# Patient Record
Sex: Male | Born: 1955 | Race: White | Hispanic: No | Marital: Married | Smoking: Former smoker
Health system: Southern US, Community
[De-identification: ages and names within clinical notes are randomized; demographics above are authoritative.]

## PROBLEM LIST (undated history)

## (undated) DIAGNOSIS — Z9889 Other specified postprocedural states: Secondary | ICD-10-CM

## (undated) DIAGNOSIS — K219 Gastro-esophageal reflux disease without esophagitis: Secondary | ICD-10-CM

## (undated) DIAGNOSIS — K5900 Constipation, unspecified: Secondary | ICD-10-CM

## (undated) DIAGNOSIS — R112 Nausea with vomiting, unspecified: Secondary | ICD-10-CM

## (undated) DIAGNOSIS — K5792 Diverticulitis of intestine, part unspecified, without perforation or abscess without bleeding: Secondary | ICD-10-CM

## (undated) DIAGNOSIS — E785 Hyperlipidemia, unspecified: Secondary | ICD-10-CM

## (undated) DIAGNOSIS — R202 Paresthesia of skin: Secondary | ICD-10-CM

## (undated) DIAGNOSIS — N529 Male erectile dysfunction, unspecified: Secondary | ICD-10-CM

## (undated) DIAGNOSIS — M549 Dorsalgia, unspecified: Secondary | ICD-10-CM

## (undated) DIAGNOSIS — T4145XA Adverse effect of unspecified anesthetic, initial encounter: Secondary | ICD-10-CM

## (undated) DIAGNOSIS — G709 Myoneural disorder, unspecified: Secondary | ICD-10-CM

## (undated) DIAGNOSIS — T8859XA Other complications of anesthesia, initial encounter: Secondary | ICD-10-CM

## (undated) DIAGNOSIS — M1990 Unspecified osteoarthritis, unspecified site: Secondary | ICD-10-CM

## (undated) DIAGNOSIS — K5790 Diverticulosis of intestine, part unspecified, without perforation or abscess without bleeding: Secondary | ICD-10-CM

## (undated) DIAGNOSIS — F419 Anxiety disorder, unspecified: Secondary | ICD-10-CM

## (undated) HISTORY — PX: POLYPECTOMY: SHX149

## (undated) HISTORY — DX: Hyperlipidemia, unspecified: E78.5

## (undated) HISTORY — DX: Dorsalgia, unspecified: M54.9

## (undated) HISTORY — DX: Unspecified osteoarthritis, unspecified site: M19.90

## (undated) HISTORY — DX: Diverticulosis of intestine, part unspecified, without perforation or abscess without bleeding: K57.90

## (undated) HISTORY — PX: SPINAL CORD STIMULATOR IMPLANT: SHX2422

## (undated) HISTORY — PX: SHOULDER SURGERY: SHX246

## (undated) HISTORY — DX: Myoneural disorder, unspecified: G70.9

## (undated) HISTORY — DX: Diverticulitis of intestine, part unspecified, without perforation or abscess without bleeding: K57.92

## (undated) HISTORY — PX: COLONOSCOPY: SHX174

## (undated) HISTORY — PX: SPINE SURGERY: SHX786

## (undated) HISTORY — DX: Paresthesia of skin: R20.2

## (undated) HISTORY — PX: LAMINECTOMY: SHX219

## (undated) HISTORY — DX: Gastro-esophageal reflux disease without esophagitis: K21.9

## (undated) HISTORY — DX: Anxiety disorder, unspecified: F41.9

## (undated) HISTORY — DX: Constipation, unspecified: K59.00

## (undated) HISTORY — DX: Male erectile dysfunction, unspecified: N52.9

## (undated) HISTORY — PX: BACK SURGERY: SHX140

## (undated) HISTORY — PX: APPENDECTOMY: SHX54

---
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
## 1998-04-11 ENCOUNTER — Ambulatory Visit (HOSPITAL_COMMUNITY): Admission: RE | Admit: 1998-04-11 | Discharge: 1998-04-11 | Payer: Self-pay | Admitting: *Deleted

## 1998-04-26 ENCOUNTER — Inpatient Hospital Stay (HOSPITAL_COMMUNITY): Admission: RE | Admit: 1998-04-26 | Discharge: 1998-04-30 | Payer: Self-pay | Admitting: Neurosurgery

## 2000-06-12 ENCOUNTER — Encounter: Admission: RE | Admit: 2000-06-12 | Discharge: 2000-06-12 | Payer: Self-pay | Admitting: Neurosurgery

## 2000-06-12 ENCOUNTER — Encounter: Payer: Self-pay | Admitting: Neurosurgery

## 2000-11-13 ENCOUNTER — Inpatient Hospital Stay (HOSPITAL_COMMUNITY): Admission: EM | Admit: 2000-11-13 | Discharge: 2000-11-16 | Payer: Self-pay | Admitting: Emergency Medicine

## 2000-11-13 ENCOUNTER — Encounter (INDEPENDENT_AMBULATORY_CARE_PROVIDER_SITE_OTHER): Payer: Self-pay | Admitting: Specialist

## 2001-01-05 ENCOUNTER — Encounter: Payer: Self-pay | Admitting: Surgery

## 2001-01-05 ENCOUNTER — Encounter: Admission: RE | Admit: 2001-01-05 | Discharge: 2001-01-05 | Payer: Self-pay | Admitting: Surgery

## 2004-09-23 ENCOUNTER — Ambulatory Visit: Payer: Self-pay | Admitting: Gastroenterology

## 2005-02-28 ENCOUNTER — Ambulatory Visit: Payer: Self-pay | Admitting: Family Medicine

## 2005-03-03 ENCOUNTER — Ambulatory Visit: Payer: Self-pay | Admitting: Family Medicine

## 2006-04-08 ENCOUNTER — Ambulatory Visit: Payer: Self-pay | Admitting: Family Medicine

## 2006-04-16 ENCOUNTER — Ambulatory Visit: Payer: Self-pay | Admitting: Family Medicine

## 2006-05-14 ENCOUNTER — Ambulatory Visit: Payer: Self-pay | Admitting: Family Medicine

## 2006-10-06 ENCOUNTER — Ambulatory Visit: Payer: Self-pay | Admitting: Family Medicine

## 2006-10-27 ENCOUNTER — Ambulatory Visit: Payer: Self-pay | Admitting: Family Medicine

## 2006-11-27 ENCOUNTER — Ambulatory Visit: Payer: Self-pay | Admitting: Family Medicine

## 2007-04-16 ENCOUNTER — Encounter: Payer: Self-pay | Admitting: Family Medicine

## 2007-04-18 DIAGNOSIS — R7989 Other specified abnormal findings of blood chemistry: Secondary | ICD-10-CM

## 2007-04-18 DIAGNOSIS — R739 Hyperglycemia, unspecified: Secondary | ICD-10-CM | POA: Insufficient documentation

## 2007-04-18 DIAGNOSIS — H9190 Unspecified hearing loss, unspecified ear: Secondary | ICD-10-CM | POA: Insufficient documentation

## 2007-04-20 ENCOUNTER — Ambulatory Visit: Payer: Self-pay | Admitting: Family Medicine

## 2007-04-20 LAB — CONVERTED CEMR LAB
AST: 24 units/L (ref 0–37)
Bilirubin, Direct: 0.1 mg/dL (ref 0.0–0.3)
Chloride: 113 meq/L — ABNORMAL HIGH (ref 96–112)
Cholesterol: 163 mg/dL (ref 0–200)
Creatinine, Ser: 0.9 mg/dL (ref 0.4–1.5)
Eosinophils Relative: 0.6 % (ref 0.0–5.0)
Glucose, Bld: 100 mg/dL — ABNORMAL HIGH (ref 70–99)
HCT: 44.4 % (ref 39.0–52.0)
HDL: 45.7 mg/dL (ref >39.0)
Hemoglobin: 15.3 g/dL (ref 13.0–17.0)
LDL Cholesterol: 101 mg/dL — ABNORMAL HIGH (ref 0–99)
MCV: 87.7 fL (ref 78.0–100.0)
Neutrophils Relative %: 63.4 % (ref 43.0–77.0)
PSA: 0.32 ng/mL (ref 0.10–4.00)
RBC: 5.06 M/uL (ref 4.22–5.81)
RDW: 12.8 % (ref 11.5–14.6)
Sodium: 146 meq/L — ABNORMAL HIGH (ref 135–145)
Total Bilirubin: 0.9 mg/dL (ref 0.3–1.2)
Total Protein: 7.5 g/dL (ref 6.0–8.3)
Triglycerides: 83 mg/dL (ref 0–149)
WBC: 6.8 10*3/uL (ref 4.5–10.5)

## 2007-04-22 ENCOUNTER — Ambulatory Visit: Payer: Self-pay | Admitting: Family Medicine

## 2007-04-22 DIAGNOSIS — F3289 Other specified depressive episodes: Secondary | ICD-10-CM | POA: Insufficient documentation

## 2007-04-22 DIAGNOSIS — F329 Major depressive disorder, single episode, unspecified: Secondary | ICD-10-CM | POA: Insufficient documentation

## 2007-04-22 DIAGNOSIS — E7800 Pure hypercholesterolemia, unspecified: Secondary | ICD-10-CM | POA: Insufficient documentation

## 2007-05-20 ENCOUNTER — Encounter (INDEPENDENT_AMBULATORY_CARE_PROVIDER_SITE_OTHER): Payer: Self-pay | Admitting: *Deleted

## 2007-05-20 ENCOUNTER — Ambulatory Visit: Payer: Self-pay | Admitting: Family Medicine

## 2007-05-25 ENCOUNTER — Ambulatory Visit: Payer: Self-pay | Admitting: Family Medicine

## 2007-06-16 ENCOUNTER — Encounter: Payer: Self-pay | Admitting: Family Medicine

## 2007-06-29 ENCOUNTER — Ambulatory Visit: Payer: Self-pay | Admitting: Family Medicine

## 2007-09-19 ENCOUNTER — Encounter: Admission: RE | Admit: 2007-09-19 | Discharge: 2007-09-19 | Payer: Self-pay | Admitting: Neurosurgery

## 2007-10-25 ENCOUNTER — Telehealth: Payer: Self-pay | Admitting: Family Medicine

## 2007-10-26 ENCOUNTER — Ambulatory Visit: Payer: Self-pay | Admitting: Family Medicine

## 2007-12-07 ENCOUNTER — Encounter: Payer: Self-pay | Admitting: Family Medicine

## 2008-01-25 ENCOUNTER — Telehealth: Payer: Self-pay | Admitting: Family Medicine

## 2008-02-03 ENCOUNTER — Encounter: Payer: Self-pay | Admitting: Family Medicine

## 2008-02-29 ENCOUNTER — Inpatient Hospital Stay (HOSPITAL_COMMUNITY): Admission: RE | Admit: 2008-02-29 | Discharge: 2008-03-04 | Payer: Self-pay | Admitting: Neurosurgery

## 2008-03-15 ENCOUNTER — Encounter: Payer: Self-pay | Admitting: Family Medicine

## 2008-04-17 ENCOUNTER — Encounter: Payer: Self-pay | Admitting: Family Medicine

## 2008-04-19 ENCOUNTER — Ambulatory Visit: Payer: Self-pay | Admitting: Family Medicine

## 2008-04-19 LAB — CONVERTED CEMR LAB
AST: 24 units/L (ref 0–37)
Albumin: 4.2 g/dL (ref 3.5–5.2)
Alkaline Phosphatase: 68 units/L (ref 39–117)
BUN: 11 mg/dL (ref 6–23)
Eosinophils Relative: 1.8 % (ref 0.0–5.0)
GFR calc Af Amer: 132 mL/min
Glucose, Bld: 107 mg/dL — ABNORMAL HIGH (ref 70–99)
HCT: 38.8 % — ABNORMAL LOW (ref 39.0–52.0)
Hemoglobin: 13 g/dL (ref 13.0–17.0)
Microalb Creat Ratio: 3.8 mg/g (ref 0.0–30.0)
Monocytes Absolute: 0.6 10*3/uL (ref 0.1–1.0)
Monocytes Relative: 8.7 % (ref 3.0–12.0)
Platelets: 165 10*3/uL (ref 150–400)
Potassium: 4.2 meq/L (ref 3.5–5.1)
RBC: 4.45 M/uL (ref 4.22–5.81)
Total CHOL/HDL Ratio: 4.7
Total Protein: 7.2 g/dL (ref 6.0–8.3)
WBC: 6.5 10*3/uL (ref 4.5–10.5)

## 2008-04-25 ENCOUNTER — Ambulatory Visit: Payer: Self-pay | Admitting: Family Medicine

## 2008-05-12 ENCOUNTER — Ambulatory Visit: Payer: Self-pay | Admitting: Family Medicine

## 2008-05-12 LAB — FECAL OCCULT BLOOD, GUAIAC: Fecal Occult Blood: NEGATIVE

## 2008-05-12 LAB — CONVERTED CEMR LAB
OCCULT 2: NEGATIVE
OCCULT 3: NEGATIVE

## 2008-05-15 ENCOUNTER — Encounter (INDEPENDENT_AMBULATORY_CARE_PROVIDER_SITE_OTHER): Payer: Self-pay | Admitting: *Deleted

## 2008-06-20 ENCOUNTER — Encounter: Payer: Self-pay | Admitting: Family Medicine

## 2008-07-10 ENCOUNTER — Encounter: Payer: Self-pay | Admitting: Family Medicine

## 2008-07-25 ENCOUNTER — Telehealth: Payer: Self-pay | Admitting: Family Medicine

## 2008-08-28 ENCOUNTER — Telehealth: Payer: Self-pay | Admitting: Family Medicine

## 2008-09-13 ENCOUNTER — Encounter: Payer: Self-pay | Admitting: Family Medicine

## 2008-09-27 ENCOUNTER — Telehealth: Payer: Self-pay | Admitting: Family Medicine

## 2008-10-30 ENCOUNTER — Telehealth: Payer: Self-pay | Admitting: Family Medicine

## 2008-11-30 ENCOUNTER — Telehealth: Payer: Self-pay | Admitting: Family Medicine

## 2008-12-20 ENCOUNTER — Encounter: Payer: Self-pay | Admitting: Family Medicine

## 2009-01-01 ENCOUNTER — Telehealth: Payer: Self-pay | Admitting: Family Medicine

## 2009-02-01 ENCOUNTER — Telehealth: Payer: Self-pay | Admitting: Family Medicine

## 2009-03-14 ENCOUNTER — Telehealth: Payer: Self-pay | Admitting: Family Medicine

## 2009-03-21 ENCOUNTER — Inpatient Hospital Stay (HOSPITAL_COMMUNITY): Admission: RE | Admit: 2009-03-21 | Discharge: 2009-03-22 | Payer: Self-pay | Admitting: Neurosurgery

## 2009-03-28 ENCOUNTER — Ambulatory Visit (HOSPITAL_COMMUNITY): Admission: RE | Admit: 2009-03-28 | Discharge: 2009-03-28 | Payer: Self-pay | Admitting: Neurosurgery

## 2009-04-09 ENCOUNTER — Encounter: Payer: Self-pay | Admitting: Family Medicine

## 2009-04-24 ENCOUNTER — Ambulatory Visit: Payer: Self-pay | Admitting: Family Medicine

## 2009-04-24 LAB — CONVERTED CEMR LAB
Albumin: 4.4 g/dL (ref 3.5–5.2)
Alkaline Phosphatase: 52 units/L (ref 39–117)
BUN: 11 mg/dL (ref 6–23)
Basophils Absolute: 0.1 10*3/uL (ref 0.0–0.1)
Bilirubin, Direct: 0.1 mg/dL (ref 0.0–0.3)
CO2: 30 meq/L (ref 19–32)
Calcium: 9.3 mg/dL (ref 8.4–10.5)
Cholesterol: 154 mg/dL (ref 0–200)
Creatinine, Ser: 0.9 mg/dL (ref 0.4–1.5)
Eosinophils Absolute: 0.2 10*3/uL (ref 0.0–0.7)
Glucose, Bld: 95 mg/dL (ref 70–99)
HDL: 44.3 mg/dL (ref >39.00)
Lymphocytes Relative: 38.1 % (ref 12.0–46.0)
MCHC: 35.1 g/dL (ref 30.0–36.0)
Monocytes Absolute: 0.5 10*3/uL (ref 0.1–1.0)
Neutrophils Relative %: 49.7 % (ref 43.0–77.0)
PSA: 0.31 ng/mL (ref 0.10–4.00)
RDW: 13.3 % (ref 11.5–14.6)
Triglycerides: 61 mg/dL (ref 0.0–149.0)

## 2009-04-25 LAB — CONVERTED CEMR LAB: Vit D, 25-Hydroxy: 26 ng/mL — ABNORMAL LOW (ref 30–89)

## 2009-04-26 ENCOUNTER — Ambulatory Visit: Payer: Self-pay | Admitting: Family Medicine

## 2009-05-07 ENCOUNTER — Encounter: Payer: Self-pay | Admitting: Family Medicine

## 2009-06-04 ENCOUNTER — Encounter (INDEPENDENT_AMBULATORY_CARE_PROVIDER_SITE_OTHER): Payer: Self-pay | Admitting: *Deleted

## 2009-07-17 ENCOUNTER — Encounter: Payer: Self-pay | Admitting: Family Medicine

## 2009-08-30 ENCOUNTER — Encounter (INDEPENDENT_AMBULATORY_CARE_PROVIDER_SITE_OTHER): Payer: Self-pay | Admitting: *Deleted

## 2009-09-28 ENCOUNTER — Encounter (INDEPENDENT_AMBULATORY_CARE_PROVIDER_SITE_OTHER): Payer: Self-pay

## 2009-10-02 ENCOUNTER — Ambulatory Visit: Payer: Self-pay | Admitting: Gastroenterology

## 2009-10-03 ENCOUNTER — Encounter: Payer: Self-pay | Admitting: Family Medicine

## 2009-10-18 ENCOUNTER — Ambulatory Visit: Payer: Self-pay | Admitting: Gastroenterology

## 2009-10-18 LAB — HM COLONOSCOPY: HM Colonoscopy: NORMAL

## 2009-10-22 ENCOUNTER — Encounter: Payer: Self-pay | Admitting: Family Medicine

## 2009-12-12 ENCOUNTER — Telehealth: Payer: Self-pay | Admitting: Family Medicine

## 2010-01-09 ENCOUNTER — Telehealth: Payer: Self-pay | Admitting: Family Medicine

## 2010-02-04 ENCOUNTER — Encounter: Payer: Self-pay | Admitting: Family Medicine

## 2010-02-11 ENCOUNTER — Telehealth: Payer: Self-pay | Admitting: Family Medicine

## 2010-03-15 IMAGING — CR DG THORACOLUMBAR SPINE 2V
1 series · 1 of 1 positions shown · non-contrast
Comparison: [DATE]

CLINICAL DATA: Spinal cord stimulator placement.

THORACOLUMBAR SPINE - 2 VIEW

[view not recorded]
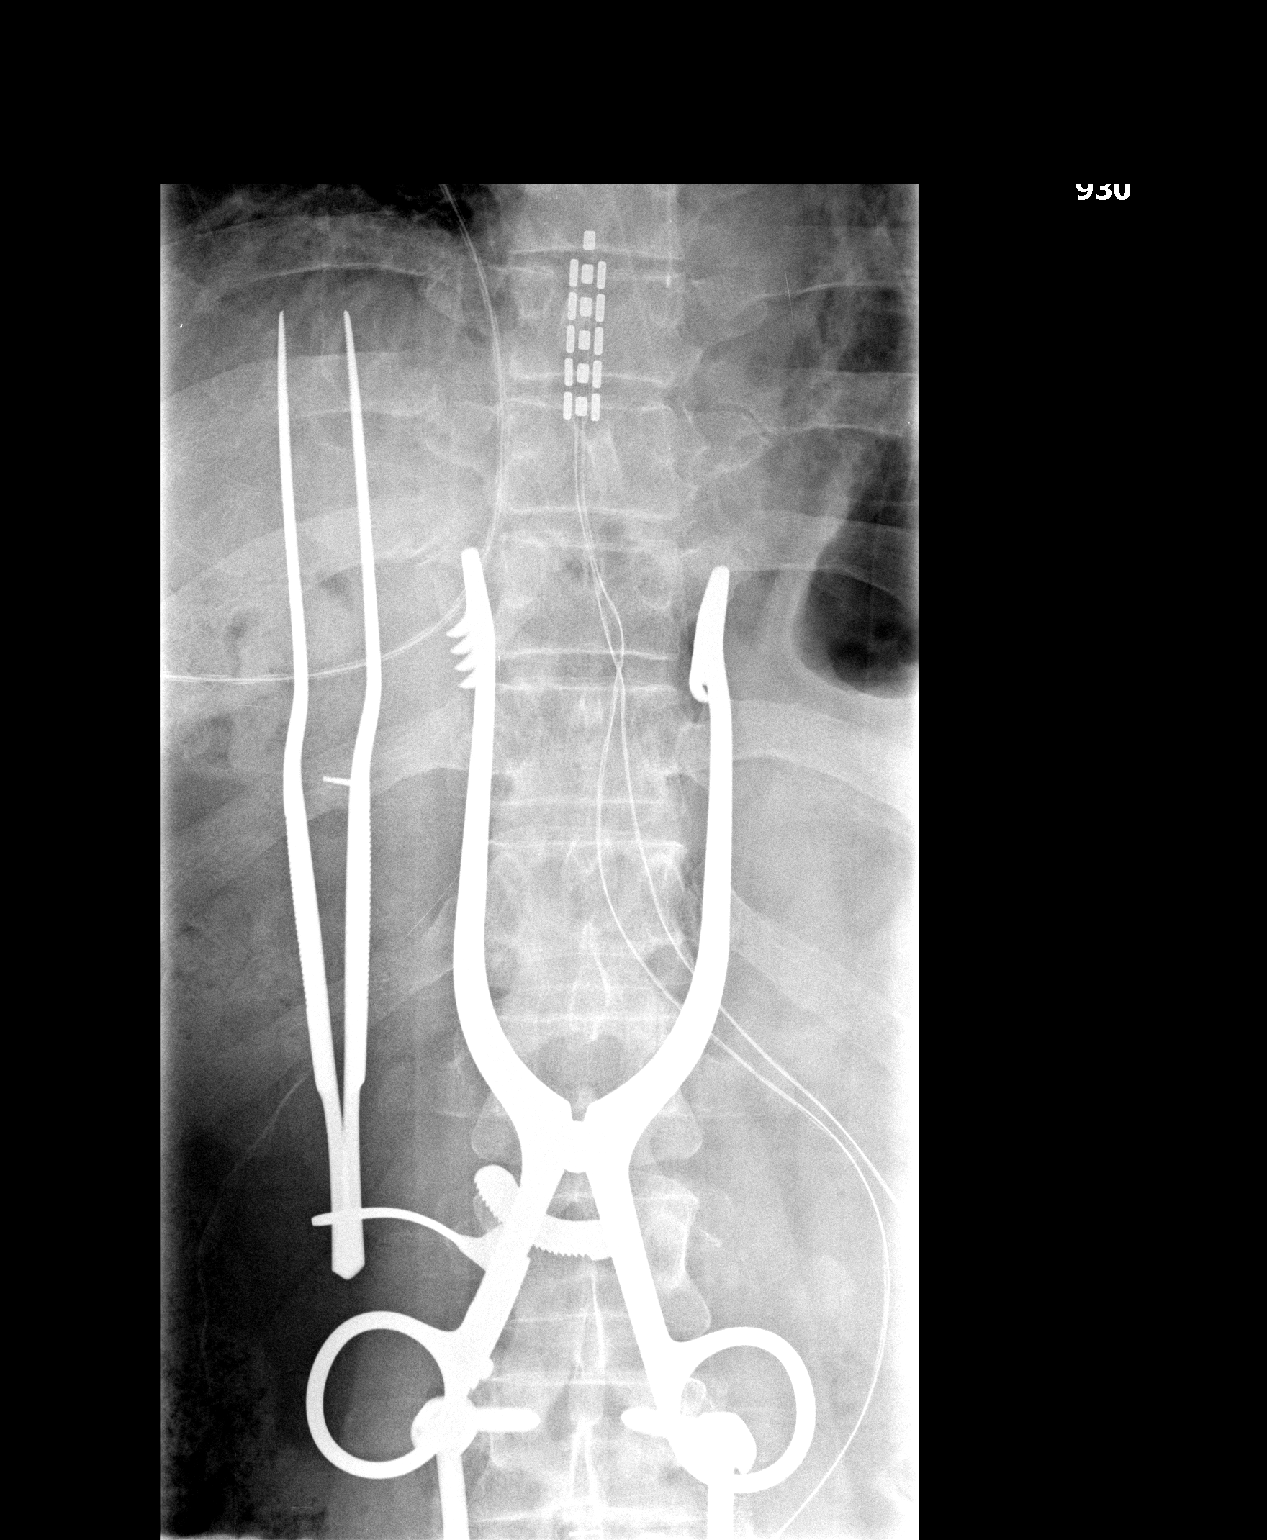

[1 of 1 positions shown; findings below may reference images not displayed]

FINDINGS: An initial film shows a needle marking the T12 level.  A
second film shows a sponge is in place at the T10 level.  A third
film shows stimulators in place in the midline from the T7-8 disc
space to the T8-9 disc space.  A final film shows the stimulator
positioned from the T8-9 disc space to the T9-10 disc space.
IMPRESSION: Spinal cord stimulator in the midline centered at T9

## 2010-03-22 ENCOUNTER — Telehealth: Payer: Self-pay | Admitting: Family Medicine

## 2010-05-10 ENCOUNTER — Telehealth (INDEPENDENT_AMBULATORY_CARE_PROVIDER_SITE_OTHER): Payer: Self-pay | Admitting: *Deleted

## 2010-05-13 ENCOUNTER — Ambulatory Visit: Payer: Self-pay | Admitting: Family Medicine

## 2010-05-14 LAB — CONVERTED CEMR LAB
ALT: 27 units/L (ref 0–53)
Bilirubin, Direct: 0.2 mg/dL (ref 0.0–0.3)
Chloride: 101 meq/L (ref 96–112)
Cholesterol: 151 mg/dL (ref 0–200)
Creatinine, Ser: 0.8 mg/dL (ref 0.4–1.5)
GFR calc non Af Amer: 105.99 mL/min (ref >60)
HDL: 43.4 mg/dL (ref >39.00)
LDL Cholesterol: 88 mg/dL (ref 0–99)
PSA: 0.36 ng/mL (ref 0.10–4.00)
Potassium: 4.3 meq/L (ref 3.5–5.1)
Total Bilirubin: 0.8 mg/dL (ref 0.3–1.2)
Total CHOL/HDL Ratio: 3
Triglycerides: 100 mg/dL (ref 0.0–149.0)
VLDL: 20 mg/dL (ref 0.0–40.0)

## 2010-05-17 ENCOUNTER — Ambulatory Visit: Payer: Self-pay | Admitting: Family Medicine

## 2010-06-10 ENCOUNTER — Encounter: Payer: Self-pay | Admitting: Family Medicine

## 2010-06-20 ENCOUNTER — Encounter: Admission: RE | Admit: 2010-06-20 | Discharge: 2010-06-20 | Payer: Self-pay | Admitting: Neurosurgery

## 2010-07-10 ENCOUNTER — Encounter: Payer: Self-pay | Admitting: Family Medicine

## 2010-11-21 NOTE — Progress Notes (Signed)
Summary: Rx Hydrocodone  Phone Note Refill Request Call back at [phone] Message from:  [facility] on January 09, 2010 8:21 AM  Refills Requested: Medication #1:  HYDROCODONE-ACETAMINOPHEN 10-325 MG TABS one tab by mouth three  times a day as needed for back pain.   Last Refilled: 12/12/2009  Method Requested: Telephone to Pharmacy Initial call taken by: Sydell Axon LPN,  January 09, 2010 8:22 AM  Follow-up for Phone Call        Oxycodone Rx. was a mistake, not phoned in. Follow-up by: Delilah Shan CMA Duncan Dull),  January 09, 2010 12:36 PM  Additional Follow-up for Phone Call Additional follow up Details #1::        Medication phoned to pharmacy.  Additional Follow-up by: Delilah Shan CMA Duncan Dull),  January 09, 2010 12:38 PM    Prescriptions: HYDROCODONE-ACETAMINOPHEN 10-325 MG TABS (HYDROCODONE-ACETAMINOPHEN) one tab by mouth three  times a day as needed for back pain  #90 x 0   Entered and Authorized by:   Ruthe Mannan MD   Signed by:   Ruthe Mannan MD on 01/09/2010   Method used:   Handwritten   RxID:   [id number] OXYCODONE HCL 5 MG TABS (OXYCODONE HCL) 2-4 tabs every 6 hrs as needed  #180 x 0   Entered and Authorized by:   Ruthe Mannan MD   Signed by:   Ruthe Mannan MD on 01/09/2010   Method used:   Handwritten   RxID:   [id number]

## 2010-11-21 NOTE — Assessment & Plan Note (Signed)
Summary: CPX/CLE   Vital Signs:  Patient profile:   55 year old male Height:      77 inches Weight:      210.25 pounds BMI:     25.02 Temp:     98.3 degrees F oral Pulse rate:   76 / minute Pulse rhythm:   regular BP sitting:   122 / 74  (left arm) Cuff size:   large  Vitals Entered By: Delilah Shan CMA Duncan Dull) (May 17, 2010 8:34 AM) CC: CPX, Preventive Care   History of Present Illness: Here for CPE.   See prev med.   Back pain per Dr. Vear Clock.   Elevated Cholesterol: Using medications without problems:yes Muscle aches: no Other complaints: no  labs reviewed.  mild increase in glucose,but patient had a large dessert the day before the labs.  No h/o DM2 in patient.    Current Medications (verified): 1)  Colace 100 Mg  Caps (Docusate Sodium) .Marland Kitchen.. 1 Daily By Mouth 2)  Lipitor 40 Mg Tabs (Atorvastatin Calcium) .... Take 1/2 Tablet At Bedtime By Mouth 3)  Stool Softener With Laxative .Marland Kitchen.. 50 Mg.   Take 1 Tablet By Mouth Once A Day 4)  Exalgo 8 Mg Xr24h-Tab (Hydromorphone Hcl) .... Take 1 Tablet By Mouth Once A Day 5)  Multivitamins   Tabs (Multiple Vitamin) .... Take 1 Tablet By Mouth Once A Day  Allergies: 1)  ! * Lexapro 2)  ! Zoloft  Past History:  Past Surgical History: Last updated: 10/21/2009 Laminectomy L4/5 1975 Laminectomy L5/S1 1990 [place] Cage procedure L4/5 (Dr. Venetia Maxon) 07/99 Hosp/ surgery abd. pain-diverticulosis with rupture 01/02 BE, divertics, polyp at hepatic flexure 03/02 Colonoscophy wnl Arlyce Dice) 2005 5 years  Lumbar back surgery L3/4 ans L5/S1 rods and screws L3-S1 (Dr Channing Mutters) 02/29/2008 Colonoscopy Nml (Dr Arlyce Dice) 10/18/2009         86yrs  Family History: Last updated: 05/17/2010 Father: Alive, with heart attack, elevated cholesterol, and 5 back surgeries. 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 Vear Clock with pain clinic for back 2011 HLD  Family History: Reviewed history from 04/26/2009 and no changes required. Father: Alive, with heart attack, elevated cholesterol, and 5 back surgeries. Mother:dead 61   Alzheimer Half- brother dec 66  colon cancer, recurrent s/p partial resection, abd tumor Sister A 57  alive and well  Social History: Reviewed history from 04/26/2009 and no changes required. 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  Otherwise negative.    Physical Exam  General:  GEN: nad, alert and oriented, uncomfortable from back pain HEENT: mucous membranes moist, tm wnl bilateral  NECK: supple w/o LA CV: rrr.  no murmur PULM: ctab, no inc wob ABD: soft, +bs EXT: no edema SKIN: no acute rash but old scars noted on back Rectal:  No external abnormalities noted. Normal sphincter tone. No rectal masses or tenderness. Heme neg stool Prostate:  Prostate gland firm and smooth, no enlargement, nodularity, tenderness,  mass, asymmetry or induration.    Impression & Recommendations:  Problem # 1:  HEALTH MAINTENANCE EXAM (ICD-V70.0) See prev med below.  Pt to follow up with pain clinic.  Check glucose yearly.   Problem # 2:  HYPERCHOLESTEROLEMIA (297 LDL 210) (ICD-272.0) Controlled, no  change in meds.  His updated medication list for this problem includes:    Lipitor 40 Mg Tabs (Atorvastatin calcium) .Marland Kitchen... Take 1/2 tablet at bedtime by mouth  Complete Medication List: 1)  Colace 100 Mg Caps (Docusate sodium) .Marland Kitchen.. 1 daily by mouth 2)  Lipitor 40 Mg Tabs  (Atorvastatin calcium) .... Take 1/2 tablet at bedtime by mouth 3)  Stool Softener With Laxative  .Marland Kitchen.. 50 mg.   take 1 tablet by mouth once a day 4)  Exalgo 8 Mg Xr24h-tab (Hydromorphone hcl) .... Take 1 tablet by mouth once a day 5)  Multivitamins Tabs (Multiple vitamin) .... Take 1 tablet by mouth once a day  Colorectal Screening:  Current Recommendations:    Hemoccult: NEG X 1 today  PSA Screening:    PSA: 0.36  (05/13/2010)  Immunization & Chemoprophylaxis:    Tetanus vaccine: Td  (01/02/2003)  Patient Instructions: 1)  Please schedule a follow-up appointment in 1 year for a physical.  Let us know if you need anything in the meantime.  Take care.   Current Allergies (reviewed today): ! * LEXAPRO ! ZOLOFT    Prevention & Chronic Care Immunizations   Influenza vaccine: Not documented   Influenza vaccine deferral: Not available  (05/17/2010)   Influenza vaccine due: 06/20/2010    Tetanus booster: 01/02/2003: Td    Pneumococcal vaccine: Not documented  Colorectal Screening   Hemoccult: negative  (05/12/2008)   Hemoccult action/deferral: NEG X 1 today  (05/17/2010)    Colonoscopy: DONE  (10/18/2009)   Colonoscopy due: 09/2014  Other Screening   PSA: 0.36  (05/13/2010)   Smoking status: quit  (04/26/2009)  Lipids   Total Cholesterol: 151  (05/13/2010)   LDL: 88  (05/13/2010)   LDL Direct: Not documented   HDL: 43.40  (05/13/2010)   Triglycerides: 100.0  (05/13/2010)    SGOT (AST): 20  (05/13/2010)   SGPT (ALT): 27  (05/13/2010)   Alkaline phosphatase: 53  (05/13/2010)   Total bilirubin: 0.8  (05/13/2010)    Lipid flowsheet reviewed?: Yes  Self-Management Support :    Lipid self-management support: Not documented

## 2010-11-21 NOTE — Progress Notes (Signed)
Summary: refill request for vicodin  Phone Note Refill Request Message from:  Fax from Pharmacy  Refills Requested: Medication #1:  HYDROCODONE-ACETAMINOPHEN 10-325 MG TABS one tab by mouth three  times a day as needed for back pain.   Last Refilled: 02/12/2010 Faxed request from [facility] [place], [phone].   Initial call taken by: Lowella Petties CMA,  March 22, 2010 2:14 PM  Follow-up for Phone Call        Rx called to pharmacy Follow-up by: Linde Gillis CMA Duncan Dull),  March 22, 2010 3:07 PM    Prescriptions: HYDROCODONE-ACETAMINOPHEN 10-325 MG TABS (HYDROCODONE-ACETAMINOPHEN) one tab by mouth three  times a day as needed for back pain  #90 x 0   Entered and Authorized by:   Ruthe Mannan MD   Signed by:   Ruthe Mannan MD on 03/22/2010   Method used:   Telephoned to ...       [facility]* (mail-order)             ,          Ph: [phone]       Fax: [phone]   RxID:   [id number]

## 2010-11-21 NOTE — Progress Notes (Signed)
----   Converted from flag ---- ---- 05/10/2010 12:14 PM, Crawford Givens MD wrote: psa v76.44 cmet/lipid 272.0   ---- 05/10/2010 11:40 AM, Mills Koller wrote: Patient is scheduled for a cpx w/ you. I need lab orders w/dx, Please. Thanks, Terri ------------------------------

## 2010-11-21 NOTE — Progress Notes (Signed)
Summary: Rx Hydrocodone/APAP  Phone Note Refill Request Call back at [phone] Message from:  [facility] [place] on February 11, 2010 9:03 AM  Refills Requested: Medication #1:  HYDROCODONE-ACETAMINOPHEN 10-325 MG TABS one tab by mouth three  times a day as needed for back pain.   Last Refilled: 01/09/2010 Received faxed refill request please advise.  Dr. Dayton Martes out of the office until tomorrow.   Method Requested: Telephone to Pharmacy Initial call taken by: Linde Gillis CMA Duncan Dull),  February 11, 2010 9:04 AM  Follow-up for Phone Call        This is my patient. Dr Dayton Martes merely refilled a the medication last month. Follow-up by: Shaune Leeks MD,  February 11, 2010 9:06 AM  Additional Follow-up for Phone Call Additional follow up Details #1::        Rx called to pharmacy Additional Follow-up by: Linde Gillis CMA Duncan Dull),  February 11, 2010 10:16 AM    Prescriptions: HYDROCODONE-ACETAMINOPHEN 10-325 MG TABS (HYDROCODONE-ACETAMINOPHEN) one tab by mouth three  times a day as needed for back pain  #90 x 0   Entered and Authorized by:   Shaune Leeks MD   Signed by:   Shaune Leeks MD on 02/11/2010   Method used:   Print then Give to Patient   RxID:   [id number]

## 2010-11-21 NOTE — Letter (Signed)
Summary: Dr.Mark Roy,[facility],Note  Dr.Mark Roy,[facility],Note   Imported By: Beau Fanny 03/01/2010 16:35:41  _____________________________________________________________________  External Attachment:    Type:   Image     Comment:   External Document

## 2010-11-21 NOTE — Letter (Signed)
Summary: [facility]  [facility]   Imported By: Lanelle Bal 06/19/2010 09:55:48  _____________________________________________________________________  External Attachment:    Type:   Image     Comment:   External Document  Appended Document: [facility]     Clinical Lists Changes  Observations: Added new observation of PAST MED HX: Dr. Vear Clock with pain clinic for back 2011 Dr. Channing Mutters- back pain HLD (06/19/2010 13:22)       Past History:  Past Medical History: Dr. Vear Clock with pain clinic for back 2011 Dr. Channing Mutters- back pain HLD

## 2010-11-21 NOTE — Progress Notes (Signed)
Summary: Rx Hydrocodon  Phone Note Refill Request Call back at [phone] Message from:  [facility] [place] on December 12, 2009 3:26 PM  Refills Requested: Medication #1:  HYDROCODONE-ACETAMINOPHEN 10-325 MG TABS one tab by mouth three  times a day as needed for back pain.   Last Refilled: 11/12/2009 Received e-scribe refill from pharmacy   Method Requested: Telephone to Pharmacy Initial call taken by: Sydell Axon LPN,  December 12, 2009 3:29 PM  Follow-up for Phone Call        Rx called to pharmacy Follow-up by: Sydell Axon LPN,  December 12, 2009 4:54 PM    Prescriptions: HYDROCODONE-ACETAMINOPHEN 10-325 MG TABS (HYDROCODONE-ACETAMINOPHEN) one tab by mouth three  times a day as needed for back pain  #90 x 0   Entered and Authorized by:   Shaune Leeks MD   Signed by:   Shaune Leeks MD on 12/12/2009   Method used:   Print then Give to Patient   RxID:   [id number]

## 2010-11-21 NOTE — Letter (Signed)
Summary: Dr.Mark Roy,[facility],Note  Dr.Mark Roy,[facility]Note   Imported By: Beau Fanny 11/07/2009 10:41:31  _____________________________________________________________________  External Attachment:    Type:   Image     Comment:   External Document

## 2010-11-21 NOTE — Letter (Signed)
Summary: [facility]  [facility]   Imported By: Maryln Gottron 07/22/2010 13:44:17  _____________________________________________________________________  External Attachment:    Type:   Image     Comment:   External Document  Appended Document: [facility]     Clinical Lists Changes

## 2011-01-27 LAB — CBC
Hemoglobin: 14.8 g/dL (ref 13.0–17.0)
MCHC: 34.4 g/dL (ref 30.0–36.0)
Platelets: 141 10*3/uL — ABNORMAL LOW (ref 150–400)
RDW: 14 % (ref 11.5–15.5)

## 2011-01-28 LAB — COMPREHENSIVE METABOLIC PANEL
ALT: 28 U/L (ref 0–53)
AST: 21 U/L (ref 0–37)
Alkaline Phosphatase: 73 U/L (ref 39–117)
CO2: 29 mEq/L (ref 19–32)
Calcium: 9.8 mg/dL (ref 8.4–10.5)
Chloride: 104 mEq/L (ref 96–112)
GFR calc Af Amer: >60 mL/min (ref >60)
GFR calc non Af Amer: >60 mL/min (ref >60)
Glucose, Bld: 91 mg/dL (ref 70–99)
Potassium: 4.6 mEq/L (ref 3.5–5.1)
Sodium: 140 mEq/L (ref 135–145)

## 2011-01-28 LAB — URINALYSIS, ROUTINE W REFLEX MICROSCOPIC
Bilirubin Urine: NEGATIVE
Glucose, UA: NEGATIVE mg/dL
Hgb urine dipstick: NEGATIVE
Protein, ur: NEGATIVE mg/dL
Specific Gravity, Urine: 1.017 (ref 1.005–1.030)

## 2011-01-28 LAB — DIFFERENTIAL
Basophils Relative: 0 % (ref 0–1)
Eosinophils Absolute: 0.1 10*3/uL (ref 0.0–0.7)
Eosinophils Relative: 2 % (ref 0–5)
Lymphs Abs: 2.7 10*3/uL (ref 0.7–4.0)
Neutrophils Relative %: 56 % (ref 43–77)

## 2011-01-28 LAB — PROTIME-INR
INR: 1 (ref 0.00–1.49)
Prothrombin Time: 13.2 seconds (ref 11.6–15.2)

## 2011-01-28 LAB — CBC
Hemoglobin: 16.3 g/dL (ref 13.0–17.0)
RBC: 5.43 MIL/uL (ref 4.22–5.81)
WBC: 8 10*3/uL (ref 4.0–10.5)

## 2011-03-04 NOTE — H&P (Signed)
NAME:  Andrew Andrew [name], Andrew [name] NO.:  [id number]   MEDICAL RECORD NO.:  [id number]           PATIENT TYPE:  OIB   LOCATION:                               FACILITY:  [facility]   PHYSICIAN:  Payton Doughty, M.D.      DATE OF BIRTH:  02-25-1956   DATE OF ADMISSION:  03/28/2009  DATE OF DISCHARGE:                              HISTORY & PHYSICAL   ADMITTING DIAGNOSIS:  Intractable back pain with spinal cord stimulator  in place.   BODY OF TEXT:  This is a 55 year old gentleman that had intractable back  pain after numerous operations.  He has had a spinal cord stimulator  trial placed that is working well and he is here for permanent implant.   MEDICAL HISTORY:  Benign.  He has had numerous back operations and has  spondylosis.   MEDICATIONS:  Antihypertensive and chronic pain meds.   ALLERGIES:  No allergies.   SOCIAL HISTORY:  He does not smoke or drink.  He is on disability.   FAMILY HISTORY:  Mom has Alzheimer's.   REVIEW OF SYSTEMS:  Remarkable for back and leg pains.   PHYSICAL EXAMINATION:  HEENT:  Within normal limits.  NECK:  Good range of motion of his neck.  CHEST:  Clear.  CARDIAC:  Regular rate and rhythm.  ABDOMEN:  Nontender.  No hepatosplenomegaly.  EXTREMITIES:  Without clubbing or cyanosis.  Peripheral pulses are good.  He does have labral tear on the right shoulder.  GU:  Deferred.  NEUROLOGIC:  Intact.  He has dorsiflexion weakness on the right.  Sensory dysesthesias described as bilateral L4-S1 distribution.  Straight leg raise is negative.   CLINICAL IMPRESSION:  Working spinal cord stimulator for implant.  We  are going to place this on the left side because he has the right labral  tear in his shoulder.  The risks and benefits of this approach have been  discussed with him and he wished to proceed.           ______________________________  Payton Doughty, M.D.     MWR/MEDQ  D:  03/28/2009  T:  03/28/2009  Job:  [id number]

## 2011-03-04 NOTE — H&P (Signed)
NAME:  Andrew [name], Andrew [name] NO.:  [id number]   MEDICAL RECORD NO.:  [id number]          PATIENT TYPE:  INP   LOCATION:  3025                         FACILITY:  [facility]   PHYSICIAN:  Payton Doughty, M.D.      DATE OF BIRTH:  April 17, 1956   DATE OF ADMISSION:  02/29/2008  DATE OF DISCHARGE:                              HISTORY & PHYSICAL   ADMITTING DIAGNOSIS:  Spondylosis at L3-4 and L5-S1.   BODY TEXT:  This is a 55 year old right-handed white gentleman with a  long spine history.  He has had a diskectomy at L5-S1 on the left side  in 1991.  He had a diskectomy in 1975 at L4-5.  He subsequently  developed a disk recurrence at L4-5 and in 1999 underwent a fusion at L4-  5 by Dr. Venetia Maxon and did reasonably well.  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  He has had increased pain  in his back and in his legs.  Diskography was obtained at L3-4 which was  strongly concordant.  He is now admitted for fusion at L3-4 and L5-S1  bridging at the L4-5 fusion.   PAST MEDICAL HISTORY:  Medical history is really fairly benign.   MEDICATIONS:  He uses an antihypertensive and chronic pain medications.   ALLERGIES:  He has no allergies.  He also has hypercholesterolemia.   SOCIAL HISTORY:  Does not smoke or drink, and he is on disability.   FAMILY HISTORY:  Mother has Alzheimer.   REVIEW OF SYSTEMS:  Marked for back and leg pain.   PHYSICAL EXAMINATION:  HEENT EXAM:  Within normal limits.  NECK:  He has reasonable range of motion in his neck.  CHEST:  Clear.  CARDIAC EXAM:  Regular rate and rhythm.  ABDOMEN:  Nontender.  No hepatosplenomegaly.  EXTREMITIES:  No clubbing or cyanosis.  GU EXAM:  Deferred.  Peripheral pulses are good.  NEUROLOGIC:  He is awake, alert and oriented.  Cranial nerves are  intact.  Motor exam shows 5/5 strength throughout the upper and lower  extremities  except for mild dorsiflexion weakness on the right.  Sensory  dysesthesia is described bilaterally at an L4-S1 distribution.  Reflexes  are one at the right knee, absent at the left, absent at the ankles  bilaterally.  Straight leg is negative.   Radiographic results have been discussed above.   CLINICAL IMPRESSION:  Lumbar spondylosis at L3-4 with positive concord  diskography.   PLAN:  The plan is for laminectomy, diskectomy, posterior lumbar  interbody fusion at L3-4 and at L5-S1, and then pedicle screw fixation  from L3-S1 and posterolateral arthrodesis.  The risks and benefits of  this approach have been discussed with him, and he wishes to proceed.           ______________________________  Payton Doughty, M.D.     MWR/MEDQ  D:  02/29/2008  T:  02/29/2008  Job:  [id number]

## 2011-03-04 NOTE — Op Note (Signed)
NAME:  [name], TRULSON NO.:  [id number]   MEDICAL RECORD NO.:  [id number]          PATIENT TYPE:  INP   LOCATION:  3025                         FACILITY:  [facility]   PHYSICIAN:  Payton Doughty, M.D.      DATE OF BIRTH:  January 01, 1956   DATE OF PROCEDURE:  DATE OF DISCHARGE:                               OPERATIVE REPORT   PREOPERATIVE DIAGNOSIS:  Spondylosis at L3-4 and L5-S1.   POSTOPERATIVE DIAGNOSIS:  Spondylosis at L3-4 and L5-S1.   PROCEDURE:  1. L3-4, L5-S1 laminectomy.  2. Diskectomy.  3. Posterior lumbar interbody fusion.  4. L3-S1 segmental pedicle screws.  5. L3-S1 posterolateral arthrodesis.   SURGEON:  Payton Doughty, MD.   ANESTHESIA:  General endotracheal.   PREP:  __________ with alcohol wipe.   COMPLICATIONS:  None.   NURSE ASSISTANT:  [place].   DOCTOR ASSISTANT:  __________   BODY TEXT:  This is a 55 year old gentleman who has had a prior fusion  at L4-L5, now has significant spondylitic disease at L3-L4 as well as at  L5-S1 on the left.  Taken to the operating room, splinting sides  intubated, placed prone on the operating table following shave and  prepped and draped in the usual sterile fashion.  Skin was incised from  mid L2 to mid S1.  Remaining lamina and transverse processes of L3, L5  and the sacral ala as well as pedicle and transverse process of L4 were  exposed bilateral in subperiosteal plane.  Intraoperative x-ray  confirmed correctness level.  Having confirmed correctness level, at the  post articularis lamina inferior facet of L3 and L5 removed bilaterally.  This allowed access to the L5-S1 and L3-4 disk spaces.  The L3 and L4  roots were decompressed as they traverse this area as well as the L5 and  S1 roots on the left side.  At L5-S1, it was found to have a conjoint  root.  Right side exhibited more normal anatomy.  On the right side, at  both L3-L4 and L5-S1, PEEK cages were replaced at L3-L4.  It was a  straight cage.  A  12-mm saw at L5-S1, it was 7-mm cage with an 8-degree  lordosis.  They were packed with bone graft obviously from the facet  joints.  The disk spaces were similarly packed around the PEEK cages  with bone graft from the facet joints.  Pedicle screws were then placed  at L3-L4 and L5 and S1 bilaterally using standard landmarks.  They were  connected to the rods.  The transverse process and sacral ala were  decorticated with a high-speed drill and packed with BMP on the extended  matrix mixed with the patient's own  bone.  The final tightener was applied.  Intraoperative x-ray showed  good placement of cages, pedicle screws, and rods.  Successive layers of  0-Vicryl, 2-0 Vicryl, and 3-0 nylon were used to closed.  Betadine and  Telfa dressing was applied, made occlusive to the OpSite.  The patient  returned to the recovery room in good condition.  ______________________________  Payton Doughty, M.D.     MWR/MEDQ  D:  02/29/2008  T:  03/01/2008  Job:  [id number]

## 2011-03-04 NOTE — Op Note (Signed)
NAME:  Andrew [name], Andrew [name] NO.:  [id number]   MEDICAL RECORD NO.:  [id number]          PATIENT TYPE:  OIB   LOCATION:  3003                         FACILITY:  [facility]   PHYSICIAN:  Payton Doughty, M.D.      DATE OF BIRTH:  1956/07/21   DATE OF PROCEDURE:  03/28/2009  DATE OF DISCHARGE:  03/28/2009                               OPERATIVE REPORT   PREOPERATIVE DIAGNOSIS:  Implanted spinal cord stimulator.   POSTOPERATIVE DIAGNOSIS:  Implanted spinal cord stimulator.   OPERATIVE PROCEDURE:  Implantation of spinal cord stimulator battery.   SURGEON:  Payton Doughty, MD   ANESTHESIA:  General endotracheal.   PREPARATION:  Prepped and draped with alcohol wipe.   COMPLICATIONS:  None.   NURSE ASSISTANT:  Bedelia Person, MD   BODY OF TEXT:  This is a 55 year old with an implanted spinal cord  stimulator trial for implantation of his battery, taken to the operating  room, smoothly anesthetized and intubated, and placed prone on the  operating table.  Following shave, prepped and draped in the usual  sterile fashion, the old skin incision was reopened.  The wires  demonstrated in the extension wires detached, divided, and removed.  The  subcutaneous pocket was created on the left side just below the  posterior superior iliac spine, slightly lateral and through it a  subcutaneous pocket was created.  Tunnel was created between the 2  incisions.  The wires of the stimulator passed, and they were attached  to the [facility] battery and locking screw was placed.  The battery was then  placed in the subcutaneous pocket.  Impedance testing revealed  satisfactory results.  Both incisions were closed with 0 Vicryl, 2-0  Vicryl, and 3-0 nylon.  Betadine and Telfa dressings were applied to  both sides, and the patient returned to recovery room in good condition.           ______________________________  Payton Doughty, M.D.     MWR/MEDQ  D:  03/28/2009  T:  03/28/2009  Job:  [id number]

## 2011-03-04 NOTE — H&P (Signed)
NAME:  ROCKY, GLADDEN NO.:  [id number]   MEDICAL RECORD NO.:  [id number]          PATIENT TYPE:  INP   LOCATION:  3032                         FACILITY:  [facility]   PHYSICIAN:  Payton Doughty, M.D.      DATE OF BIRTH:  24-Mar-1956   DATE OF ADMISSION:  03/21/2009  DATE OF DISCHARGE:                              HISTORY & PHYSICAL   ADMITTING DIAGNOSIS:  Lumbar spondylosis with intractable back pain.   BODY TEXT:  This is a now 55 year old right-handed white gentleman who  has had diskectomy 5-1 on the left, 1991; diskectomy in 1975 at 4-5, had  a disk recurrence in 1999, and had a fusion at 4-5, did reasonably well,  has had spinal stenosis, was corrected, and had persistent back pain.  He underwent a fusion at 3-4 and 5-1 a year ago.  He still has pain in  his back and down his, mostly, right leg and is now admitted for  placement of spinal cord stimulator.   MEDICAL HISTORY:  Benign.   MEDICATIONS:  He uses an antihypertensive and chronic pain meds.   ALLERGIES:  He has no allergies.   SOCIAL HISTORY:  He does not smoke or drink, is on disability.   FAMILY HISTORY:  Mother has Alzheimer disease.   REVIEW OF SYSTEMS:  Remarkable for back pain and leg pain.   PHYSICAL EXAMINATION:  HEENT:  Within normal limits.  NECK:  He has good range of motion of his neck.  CHEST:  Clear.  CARDIAC:  Regular rate and rhythm.  ABDOMEN:  Nontender with no hepatosplenomegaly.  EXTREMITIES:  Without clubbing or cyanosis.  GU:  Deferred.  Peripheral pulses good.  NEUROLOGIC:  He is awake, alert, and oriented.  Cranial nerves intact.  Motor exam shows 5/5 strength throughout the upper and lower extremities  except for mild dorsiflexion weakness on the right.  Sensory  dysesthesias described bilaterally in L4-S1 distribution.  Reflexes are  1 at the right knee, absent in the left, absent in the ankles  bilaterally.  Straight leg raise is negative.  He has a solid fusion  that  extends from L3 to S1.   CLINICAL IMPRESSION:  Intractable back and leg pain.   PLAN:  For placement of a spinal cord stimulator for trial.  The risks  and benefits have been discussed with him, and he wished to proceed.           ______________________________  Payton Doughty, M.D.     MWR/MEDQ  D:  03/21/2009  T:  03/22/2009  Job:  [id number]

## 2011-03-04 NOTE — Discharge Summary (Signed)
NAME:  Andrew [name], Andrew [name] NO.:  [id number]   MEDICAL RECORD NO.:  [id number]          PATIENT TYPE:  INP   LOCATION:  3025                         FACILITY:  [facility]   PHYSICIAN:  Hewitt Shorts, M.D.DATE OF BIRTH:  November 02, 1955   DATE OF ADMISSION:  02/29/2008  DATE OF DISCHARGE:  03/04/2008                               DISCHARGE SUMMARY   ADMISSION HISTORY AND PHYSICAL EXAMINATION:  The patient is a 55-year-  old man under the care of Dr. Channing Mutters.  He has had long history of  difficulties with his low back with his first surgery in 1975.  He has  had number of further surgeries over the years.  General examination was  unremarkable.  Neurologic examination showed mild weakness in the right  dorsiflexor and some sensory dysfunction in distal lower extremities.  Further details of his admission history and physical examination  included in Dr. Temple Pacini admission note.   HOSPITAL COURSE:  The patient was admitted, underwent L3-L4 and L5-S1  decompression and fusion from L3 to S1.  Postoperatively, he has done  well.  He is up and ambulating in hose.  He is afebrile.  His wound is  healing well and he is being discharged to home with instructions  regarding wound care and activities.  He was seen by physical therapy  during the postoperative period and discharged and made good progress  with them.  He is to return this coming week for suture removal.  He was  a given a prescription for ciprofloxacin 500 mg b.i.d. 10 capsules no  refills to complete antibiotic course initiated in the hospital.   DISCHARGE CONDITION:  Good.   DISCHARGE DIAGNOSIS:  Lumbar spondylosis.   DISCHARGE PRESCRIPTION:  Cipro 500 mg b.i.d.  He did not require any  pain medication because he had some at home.      Hewitt Shorts, M.D.  Electronically Signed     RWN/MEDQ  D:  03/04/2008  T:  03/05/2008  Job:  [id number]

## 2011-03-04 NOTE — Op Note (Signed)
NAME:  Andrew [name], Andrew [name] NO.:  [id number]   MEDICAL RECORD NO.:  [id number]          PATIENT TYPE:  INP   LOCATION:  2899                         FACILITY:  [facility]   PHYSICIAN:  Payton Doughty, M.D.      DATE OF BIRTH:  05-23-1956   DATE OF PROCEDURE:  DATE OF DISCHARGE:                               OPERATIVE REPORT   PREOPERATIVE DIAGNOSIS:  Intractable back and leg pain.   POSTOPERATIVE DIAGNOSIS:  Intractable back and leg pain.   PROCEDURE:  Placement of spinal cord stimulator.   SURGEON:  Payton Doughty, MD   BODY OF TEXT:  This is a 55 year old with intractable pain in his back  and his right leg to about mid thigh taken to operative room, smoothly  anesthetized, intubated, and placed prone on the operating table.  Following shave, prep, and drape in the usual sterile fashion, skin was  incised over the T10-T9 area.  The plan was to place a stimulator at the  T9-T10 interval and extend up to the bottom of the T8.  The lamina of T9  was dissected free in the subperiosteal plane and intraoperative x-ray  confirmed correctness level.  Partial laminectomy at T9 and partially  T10 was done to allow access to the posterior epidural space at T9-T10.  This was achieved.  Electrode was advanced.  Films showed it was up too  high, so it was pulled back towards exactly at the T9-T10 interspace  extending over T9 and up to the bottom of T8.  Having completed this,  the wound was irrigated and hemostasis assured that was attached to the  extension wires that were exited via separate trocar incision.  Successive layers of 0-Vicryl, 2-0 Vicryl, and 3-0 nylon were used to  close.  Betadine and Telfa dressing was applied and made occlusive with  OpSite.  The patient returned to the recovery room in good condition.           ______________________________  Payton Doughty, M.D.     MWR/MEDQ  D:  03/21/2009  T:  03/21/2009  Job:  [id number]

## 2011-03-06 ENCOUNTER — Other Ambulatory Visit (HOSPITAL_COMMUNITY): Payer: Self-pay | Admitting: Neurosurgery

## 2011-03-06 DIAGNOSIS — M47816 Spondylosis without myelopathy or radiculopathy, lumbar region: Secondary | ICD-10-CM

## 2011-03-07 NOTE — Op Note (Signed)
[place]. [facility]  Patient:    Andrew [name], Andrew [name]                        MRN: [id number] Proc. Date: 11/13/00 Adm. Date:  [id number] Attending:  Andre Lefort CC:         Danae Orleans. Venetia Maxon, M.D.  Dr. Laurita Quint   Operative Report  DATE OF BIRTH:  September 01, 1956.  PREOPERATIVE DIAGNOSIS:  Acute appendicitis.  POSTOPERATIVE DIAGNOSIS:  Probable right colon diverticulitis.  PROCEDURE:  Laparoscopic appendectomy.  SURGEON:  Sandria Bales. Ezzard Standing, M.D.  ANESTHESIA:  General endotracheal.  ESTIMATED BLOOD LOSS:  Minimal.  INDICATION FOR PROCEDURE:  Mr. Rezendes is a 55 year old white male who presented with right-sided abdominal pain, leukocytosis, and physical exam consistent with probable acute appendicitis.  Patient now taken to the operating room for laparoscopic appendectomy.  DESCRIPTION OF PROCEDURE:  The patient was given 2 g of Cefotetan at initiation of the procedure, had PAS stockings in place, Foley catheter placed.  The abdomen was shaved, prepped with Betadine solution, and sterilely draped.  An infraumbilical incision was made with sharp dissection and carried down to the abdominal cavity.  A 0 degree, 10 mm laparoscope was inserted into the abdominal cavity and abdominal exploration performed.  The 0 degree scope was inserted through a 12 mm Hasson trocar and the Hasson trocar secured with a 0 Vicryl suture.  Two additional trocars were placed, a 5 mm Ethicon trocar in the right subcostal location, a 10 mm Ethicon trocar in the left lower quadrant location.  Abdominal exploration appeared a normal-looking liver, normal gallbladder, normal stomach.  He did have inflammatory changes of the peritoneal lining on the right abdomen right side. There was no inflammation of the omentum, but in the right colon I was able to see some small purulence about mid-right colon, which would be to me consistent with right-sided diverticulitis.  His  appendix actually looked grossly normal.  His terminal ileum looked normal.  There was no evidence of any Crohns disease.  I was able to go back on the small bowel about a foot and a half or two feet, saw no evidence of a Meckels.  He had no inflammation down in his pelvis, really no free pus other than this inflammatory exudate in the right lower quadrant.  I did take pictures of this area.  I went ahead and did an appendectomy using the Harmonic scalpel and dissected out the appendiceal mesentery down to the base of the appendix.  Then using a vascular Endo-GIA stapler, stapled across the base of the appendix and delivered the appendix through an Endocatch bag and sent this to pathology.  I also took photos of the right colon and where I thought the inflammatory area was at the base of the appendix and terminal ileum for documentation purposes.  I then irrigated out the abdomen, visualized each trocar as it came out and saw no bleeding at any trocar site.  The umbilical trocar was closed with a 0 Vicryl suture, each site was then infiltrated with 0.25% Marcaine anesthesia. The skin was closed at each site with 5-0 Vicryl suture, painted with tincture of benzoin and Steri-Strips, and sterilely dressed.  The patient tolerated the procedure well, was transported to the recovery room in good condition, with sponge and needle count correct at the end of the case. DD:  11/13/00 TD:  11/14/00 Job: [id number]

## 2011-03-07 NOTE — H&P (Signed)
[place]. [facility] [place]  Patient:    Andrew [name], Andrew [name]                        MRN: [id number] Adm. Date:  [id number] Attending:  Cathren Laine CC:         Arta Silence, M.D.  Danae Orleans. Venetia Maxon, M.D.   History and Physical  DATE OF BIRTH:  05/05/1956  REASON FOR ADMISSION:  Abdominal pain, probable appendicitis.  HISTORY OF PRESENT ILLNESS:  Andrew [name] is a 55 year old white male, a patient of Arta Silence, M.D.  He developed vague what he described as pleuritic pain in both sides of the chest on Wednesday, November 11, 2000.  He took some Motrin and Skelaxin, which seemed to help his symptoms.  Then yesterday, November 12, 2000, he really did not have much discomfort.  He awoke this morning about 8 or 8:30 a.m. and developed increasing abdominal pain which is localized to his right lower quadrant.  He has been somewhat nauseated.  He had some shaking chills.  He presented to the [facility] Emergency Room.  He denies any history of peptic ulcer disease, liver disease, or pancreatitis. No known colon disease, though he says that he does have somewhat irregular bowels, which have been longstanding where he has a bowel movement every to to three days.  His last bowel movement was yesterday and was regular.  ALLERGIES:  He has no allergies.  MEDICATIONS:  He is on no medications, except for Lipitor.  REVIEW OF SYSTEMS:  Pulmonary:  He quit smoking seven or eight years ago in about 1994.  He has had no pneumonia or tuberculosis.  Cardiac:  No history of chest pain or hypertension.  Gastrointestinal:  See history of present illness.  Urologic:  No history of kidney stones or kidney infections. Musculoskeletal:  He has had at least three back operations, the first in 1975 by Dr. Eligha Bridegroom and Izell Peridot. Elesa Hacker, M.D., the second by Alanson Aly. Roxan Hockey, M.D., in 1991, and with the third he had a fusion by Danae Orleans. Venetia Maxon, M.D., in 1999.  He says  that he also still has some problems with L4-5 which will require more surgery, but at this time is kind of tolerating what he has.  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  He is accompanied by his wife and father in the emergency room.  PHYSICAL EXAMINATION:  His temperature is 100.3 degrees, pulse 100, and respirations 22.  HEENT:  Unremarkable.  LUNGS:  Clear to auscultation.  HEART:  Regular rate and rhythm without murmurs or rubs.  ABDOMEN:  Pretty much absent bowel sounds with some tenderness and some guarding in his right flank to right lower quadrant.  I do not feel any abdominal masses.  I do not feel a hernia.  He has not real rebound.  EXTREMITIES:  He has good strength in all four extremities.  LABORATORY DATA:  His white blood cell count is 16,700.  His PT is 13.0.  His amylase is 73.  His sodium is 134, potassium 3.5, chloride 101, and CO2 24. His liver functions are within normal limits.  His lipase is 21.  IMPRESSION: 1. He has probable appendicitis.  I discussed this with the patient and his    family and feel that he will be best served by proceeding with  laparoscopic    appendectomy.  We also talked about the possibility of open surgery and the    possibility that this is not appendicitis, but some other disease which is    either surgical like diverticulitis or nonsurgical like gastroenteritis.  I    think that the patient and his wife understand. 2. Chronic back problems with prior back surgery. 3. Remote history of smoking. DD:  11/13/00 TD:  11/13/00 Job: [id number]

## 2011-03-07 NOTE — Discharge Summary (Signed)
[place]. [facility]  Patient:    Andrew [name], Andrew [name]                        MRN: [id number] Adm. Date:  [id number] Disc. Date: [id number] Attending:  Andre Lefort CC:         Delano Metz, M.D.  Danae Orleans. Venetia Maxon, M.D.   Discharge Summary  DATE OF BIRTH:  14-Apr-1956  DISCHARGE DIAGNOSES: 1. Right colon diverticulitis. 2. Normal appearing appendix. 3. Chronic back problems with prior back surgery. 4. Remote history of smoking.  OPERATIONS PERFORMED:  The patient had a laparoscopic appendectomy performed on the 25th of January 2002.  HISTORY OF PRESENT ILLNESS:  Mr. Gatliff is a 55 year old white male, who is a patient of Dr. Lyndel Pleasure.  He developed vague abdominal pain which he described as pleuritic chest pain on Wednesday the 23rd of January 2002.  He tried some Motrin and Skelaxin which seemed to help his symptoms some.  On the day before admission, on the 24th of January, he awoke about 8 a.m. with increasing abdominal pain localized to his right lower quadrant.  He had some nausea and some shaking chills and presented to the [facility] Emergency Room.  He denies any prior history of peptic ulcer disease or liver disease.  He has had some irregular bowel movements with some mild, longstanding constipation, where he has a bowel movement every 2-3 days.  Interestingly, he has several family members who have had diverticular disease, and he has at least one family member who has had colon cancer.  He denies any allergies.  He is on no medications except for Lipitor.  REVIEW OF SYSTEMS:  Most significant.  He has had at least three back operations, most recently in 1999 by Dr. Maeola Harman, when he had a fusion, and he had been disabled from work because of these back surgeries and back problems.  PHYSICAL EXAMINATION:  VITAL SIGNS:  Temperature 100.3, pulse 100.  ABDOMEN:  Right lower quadrant and right flank tenderness.  He had a  white blood count of 16, 700, and was felt to have probable appendicitis.  He was taken to the operating room where he underwent laparoscopic appendectomy, but his appendix was grossly normal laparoscopically.  I went on and removed it.  He appeared to have an inflammatory area centering around his right mid colon consistent with a right-sided diverticular disease.  I took pictures of these and placed in the chart.  The pathology of his appendix is not back on the chart at the time of dictation.  Postoperatively I kept him on cefotetan as an IV antibiotic, started him on a diet by the second postoperative day.  He is now three days postop.  He is afebrile.  He is tolerating his diet pretty well.  He has some mild distension, but he is passing gas, and his abdomen is getting better.  His wounds look good.  We will discharge him on Cipro 500 mg b.i.d. , Flagyl 500 mg t.i.d., give him Vicodin for pain.  He is not to drive for a couple of days and be on a low-residue diet.  He will see me back in seven days for a follow-up physical examination.  I discussed with him and his wife that he will need further long-term colonic exam, either with a barium enema or colonoscopy or both, and we will decide this after he gets out 6-8 weeks from his  acute episode.  CONDITION ON DISCHARGE:  Good. DD:  11/16/00 TD:  11/16/00 Job: [id number]

## 2011-03-14 ENCOUNTER — Ambulatory Visit (HOSPITAL_COMMUNITY)
Admission: RE | Admit: 2011-03-14 | Discharge: 2011-03-14 | Disposition: A | Discharge status: Home / Self Care | Payer: Medicare Other | Source: Ambulatory Visit | Attending: Neurosurgery | Admitting: Neurosurgery

## 2011-03-14 DIAGNOSIS — T84498A Other mechanical complication of other internal orthopedic devices, implants and grafts, initial encounter: Secondary | ICD-10-CM | POA: Insufficient documentation

## 2011-03-14 DIAGNOSIS — N200 Calculus of kidney: Secondary | ICD-10-CM | POA: Insufficient documentation

## 2011-03-14 DIAGNOSIS — M47816 Spondylosis without myelopathy or radiculopathy, lumbar region: Secondary | ICD-10-CM

## 2011-03-14 DIAGNOSIS — M48061 Spinal stenosis, lumbar region without neurogenic claudication: Secondary | ICD-10-CM | POA: Insufficient documentation

## 2011-03-14 DIAGNOSIS — Y849 Medical procedure, unspecified as the cause of abnormal reaction of the patient, or of later complication, without mention of misadventure at the time of the procedure: Secondary | ICD-10-CM | POA: Insufficient documentation

## 2011-03-14 MED ORDER — IOHEXOL 180 MG/ML  SOLN
20.0000 mL | Freq: Once | INTRAMUSCULAR | Status: AC | PRN
  Administered 2011-03-14: 20 mL via INTRATHECAL

## 2011-03-15 ENCOUNTER — Encounter: Payer: Self-pay | Admitting: Family Medicine

## 2011-03-15 DIAGNOSIS — M549 Dorsalgia, unspecified: Secondary | ICD-10-CM | POA: Insufficient documentation

## 2011-04-07 ENCOUNTER — Encounter: Payer: Self-pay | Admitting: Family Medicine

## 2011-05-06 ENCOUNTER — Telehealth: Payer: Self-pay | Admitting: Family Medicine

## 2011-05-06 DIAGNOSIS — R739 Hyperglycemia, unspecified: Secondary | ICD-10-CM

## 2011-05-06 DIAGNOSIS — Z125 Encounter for screening for malignant neoplasm of prostate: Secondary | ICD-10-CM

## 2011-05-06 NOTE — Telephone Encounter (Signed)
Orders only

## 2011-05-12 ENCOUNTER — Other Ambulatory Visit (INDEPENDENT_AMBULATORY_CARE_PROVIDER_SITE_OTHER): Payer: Medicare Other | Admitting: Family Medicine

## 2011-05-12 DIAGNOSIS — Z125 Encounter for screening for malignant neoplasm of prostate: Secondary | ICD-10-CM

## 2011-05-12 DIAGNOSIS — R7309 Other abnormal glucose: Secondary | ICD-10-CM

## 2011-05-12 DIAGNOSIS — R739 Hyperglycemia, unspecified: Secondary | ICD-10-CM

## 2011-05-12 LAB — COMPREHENSIVE METABOLIC PANEL
Albumin: 4.8 g/dL (ref 3.5–5.2)
BUN: 12 mg/dL (ref 6–23)
CO2: 27 mEq/L (ref 19–32)
Calcium: 9.1 mg/dL (ref 8.4–10.5)
GFR: 104.11 mL/min (ref >60.00)
Glucose, Bld: 101 mg/dL — ABNORMAL HIGH (ref 70–99)
Potassium: 4.1 mEq/L (ref 3.5–5.1)
Sodium: 138 mEq/L (ref 135–145)
Total Protein: 7.7 g/dL (ref 6.0–8.3)

## 2011-05-12 LAB — LIPID PANEL
Cholesterol: 168 mg/dL (ref 0–200)
Triglycerides: 95 mg/dL (ref 0.0–149.0)

## 2011-05-16 ENCOUNTER — Encounter: Payer: Self-pay | Admitting: Family Medicine

## 2011-05-19 ENCOUNTER — Encounter: Payer: Self-pay | Admitting: Family Medicine

## 2011-05-19 ENCOUNTER — Ambulatory Visit (INDEPENDENT_AMBULATORY_CARE_PROVIDER_SITE_OTHER): Payer: Medicare Other | Admitting: Family Medicine

## 2011-05-19 DIAGNOSIS — Z1211 Encounter for screening for malignant neoplasm of colon: Secondary | ICD-10-CM

## 2011-05-19 DIAGNOSIS — F329 Major depressive disorder, single episode, unspecified: Secondary | ICD-10-CM

## 2011-05-19 DIAGNOSIS — R7989 Other specified abnormal findings of blood chemistry: Secondary | ICD-10-CM

## 2011-05-19 DIAGNOSIS — Z125 Encounter for screening for malignant neoplasm of prostate: Secondary | ICD-10-CM

## 2011-05-19 DIAGNOSIS — E7800 Pure hypercholesterolemia, unspecified: Secondary | ICD-10-CM

## 2011-05-19 DIAGNOSIS — M549 Dorsalgia, unspecified: Secondary | ICD-10-CM

## 2011-05-19 MED ORDER — ATORVASTATIN CALCIUM 40 MG PO TABS: 20.0000 mg | ORAL_TABLET | Freq: Every day | ORAL | Status: DC

## 2011-05-19 MED ORDER — HYDROCODONE-ACETAMINOPHEN 10-325 MG PO TABS: 1.0000 | ORAL_TABLET | Freq: Four times a day (QID) | ORAL | Status: DC | PRN

## 2011-05-19 NOTE — Patient Instructions (Signed)
Keep taking the colace with the hydrocodone.  Let me know when you are in the last 1-2 weeks of medicine, sooner if needed.  Try to stay as active as possible.  Schedule a OV in 6 months.   I would get a flu shot each fall.   Take care.  Glad to see you.

## 2011-05-19 NOTE — Progress Notes (Signed)
Back pain continues.  He sees Dr. Venetia Maxon and he may need surgery, but this has been held for now.  He's taking hydrocodone for pain.  It does "okay" but he's had trouble with constipation.  Is on colace.  3-4 of the hydrocodone/day would allow him to get through the day.  Intolerant of hydromorphone.  He has documented loosening of S1 screw noted on imaging.  H/o spinal cord stimulator implant. Pain = R>L radicular pain.  Worse with activity.    H/o depression but mood has been stable.  No SI/HI.  Affect is bright today.    Elevated Cholesterol: Using medications without problems:yes Muscle aches: only related to back pain as above  Prostate cancer screening.  No sx and no FH of prostate CA.  PSA wnl.  We may not need to check PSA again.  We talked about this today.    Colon CA screening, due in 2015 due to FH.  PMH and SH reviewed  ROS: See HPI, otherwise noncontributory.  Meds, vitals, and allergies reviewed.   Nad, affect wnl ncat Mmm, tm wnl Neck supple rrr ctab abd soft, not ttp Back with midline pain and paraspinal tightness. L foot numb- since 1999 surgery- at baseline L foot dorsi/plantar flexion weaker than R- at baseline.   Prostate gland firm and smooth, no enlargement, nodularity, tenderness, mass, asymmetry or induration.

## 2011-05-20 ENCOUNTER — Encounter: Payer: Self-pay | Admitting: Family Medicine

## 2011-05-20 DIAGNOSIS — Z1211 Encounter for screening for malignant neoplasm of colon: Secondary | ICD-10-CM | POA: Insufficient documentation

## 2011-05-20 DIAGNOSIS — Z125 Encounter for screening for malignant neoplasm of prostate: Secondary | ICD-10-CM | POA: Insufficient documentation

## 2011-05-20 NOTE — Assessment & Plan Note (Addendum)
I wrote for hydrocodone today.  Will ask for note to be sent to Dr. Venetia Maxon- I (and patient) appreciate Dr. Fredrich Birks help.  Will attempt medical mgmt for now and try to delay surgery based on pt preference.  No red flag sx on exam today.  Discussed with patient re: controlled meds.  Patient is not to abuse, misuse, divert, or use the medicine in any way other than as prescribed.  Patient is not to seek controlled meds from multiple clinics or pharmacies.  Patient is not to use illegal drugs.  Failure to comply can prevent further prescribing of controlled meds from this clinic.    3 month supply written and given to pt today.  He'll notify pharmacy at the end of the rx and will come back for recheck in 6 months, sooner prn.

## 2011-05-20 NOTE — Assessment & Plan Note (Signed)
PSA options were discussed along with recent recs.  No clear indication for future psa at this point, since patient is low risk and there is no FH of prosate CA.  He understood.

## 2011-05-20 NOTE — Assessment & Plan Note (Signed)
Appears stable 

## 2011-05-20 NOTE — Assessment & Plan Note (Signed)
Controlled  no change in meds

## 2011-05-20 NOTE — Assessment & Plan Note (Signed)
Labs d/w pt.  Exercise, diet, check yearly

## 2011-07-16 LAB — COMPREHENSIVE METABOLIC PANEL
ALT: 33
AST: 29
BUN: 9
GFR calc non Af Amer: >60
Potassium: 4.3
Total Bilirubin: 0.6

## 2011-07-16 LAB — URINALYSIS, ROUTINE W REFLEX MICROSCOPIC
Glucose, UA: NEGATIVE
Hgb urine dipstick: NEGATIVE
Ketones, ur: NEGATIVE
Nitrite: NEGATIVE
Protein, ur: NEGATIVE
Specific Gravity, Urine: 1.012
Urobilinogen, UA: 0.2
pH: 7

## 2011-07-16 LAB — CBC
HCT: 44.4
Platelets: 165
RDW: 13.4

## 2011-07-16 LAB — TYPE AND SCREEN
ABO/RH(D): O POS
Antibody Screen: NEGATIVE

## 2011-07-16 LAB — PROTIME-INR: Prothrombin Time: 12.8

## 2011-07-16 LAB — ABO/RH: ABO/RH(D): O POS

## 2011-11-21 ENCOUNTER — Ambulatory Visit: Payer: Medicare Other | Admitting: Family Medicine

## 2011-11-25 ENCOUNTER — Ambulatory Visit (INDEPENDENT_AMBULATORY_CARE_PROVIDER_SITE_OTHER): Payer: Medicare Other | Admitting: Family Medicine

## 2011-11-25 ENCOUNTER — Encounter: Payer: Self-pay | Admitting: Family Medicine

## 2011-11-25 DIAGNOSIS — M549 Dorsalgia, unspecified: Secondary | ICD-10-CM

## 2011-11-25 MED ORDER — HYDROCODONE-ACETAMINOPHEN 10-325 MG PO TABS: 1.0000 | ORAL_TABLET | Freq: Four times a day (QID) | ORAL | Status: DC | PRN

## 2011-11-25 NOTE — Progress Notes (Signed)
Father died early 10/2011, from PNA.  He was elderly and died after brief illness.    Back pain.  With spinal cord stimulator.  S/p lumbar fusion, diskectomy.  Documented loosening of hardware prev.  He's compliant with meds with some relief. Averaging 1 hydrocodone on a good day, 3-4 on a bad day.  He's had fewer bad days recently, but he isn't caring for his father now.  On bowel regimen, with relief.  He's still trying to prolong surgery with Dr. Venetia Maxon.  No new weakness or numbness. Still with lower L spine pain in midline, occ radiation to R leg.    Meds, vitals, and allergies reviewed.   ROS: See HPI.  Otherwise, noncontributory.  Nad, affect wnl  ncat  Mmm, tm wnl  Neck supple  rrr  ctab  abd soft, not ttp  Back with midline pain and paraspinal tightness.  L foot numb- since 1999 surgery- at baseline  L foot dorsi/plantar flexion weaker than R- at baseline.

## 2011-11-25 NOTE — Patient Instructions (Addendum)
I would schedule a physical in about 6 months.  Let me know if you have concerns in the meantime.  Don't change your meds.  Glad to see you.

## 2011-11-27 NOTE — Assessment & Plan Note (Signed)
>  25 min spent with face to face with patient, >50% counseling and/or coordinating care.  Continue as is for now, pain is manageable.  On bowel regimen and okay for outpatient f/u.  He's trying to stay active and this is reasonable.  He's coping with the death of his father.

## 2012-01-07 ENCOUNTER — Other Ambulatory Visit: Payer: Self-pay | Admitting: Family Medicine

## 2012-05-12 ENCOUNTER — Other Ambulatory Visit: Payer: Self-pay | Admitting: Family Medicine

## 2012-05-12 DIAGNOSIS — E7800 Pure hypercholesterolemia, unspecified: Secondary | ICD-10-CM

## 2012-05-17 ENCOUNTER — Other Ambulatory Visit (INDEPENDENT_AMBULATORY_CARE_PROVIDER_SITE_OTHER): Payer: Medicare Other

## 2012-05-17 DIAGNOSIS — E7800 Pure hypercholesterolemia, unspecified: Secondary | ICD-10-CM

## 2012-05-17 LAB — COMPREHENSIVE METABOLIC PANEL
ALT: 28 U/L (ref 0–53)
CO2: 26 mEq/L (ref 19–32)
Creatinine, Ser: 1 mg/dL (ref 0.4–1.5)
GFR: 79.72 mL/min (ref >60.00)
Total Bilirubin: 0.7 mg/dL (ref 0.3–1.2)

## 2012-05-17 LAB — LIPID PANEL
HDL: 37.2 mg/dL — ABNORMAL LOW (ref >39.00)
Total CHOL/HDL Ratio: 4
Triglycerides: 64 mg/dL (ref 0.0–149.0)

## 2012-05-21 ENCOUNTER — Ambulatory Visit (INDEPENDENT_AMBULATORY_CARE_PROVIDER_SITE_OTHER): Payer: Medicare Other | Admitting: Family Medicine

## 2012-05-21 ENCOUNTER — Encounter: Payer: Self-pay | Admitting: Family Medicine

## 2012-05-21 VITALS — BP 120/74 | HR 80 | Temp 98.0°F | Ht 76.25 in | Wt 224.2 lb

## 2012-05-21 DIAGNOSIS — Z0000 Encounter for general adult medical examination without abnormal findings: Secondary | ICD-10-CM

## 2012-05-21 DIAGNOSIS — M549 Dorsalgia, unspecified: Secondary | ICD-10-CM

## 2012-05-21 DIAGNOSIS — E7800 Pure hypercholesterolemia, unspecified: Secondary | ICD-10-CM

## 2012-05-21 MED ORDER — ATORVASTATIN CALCIUM 40 MG PO TABS: ORAL_TABLET | ORAL | Status: DC

## 2012-05-21 NOTE — Patient Instructions (Addendum)
Don't change your meds.  I would get a flu shot each fall.   Recheck at a physical next year.  Take care.

## 2012-05-21 NOTE — Progress Notes (Signed)
I have personally reviewed the Medicare Annual Wellness questionnaire and have noted 1. The patient's medical and social history 2. Their use of alcohol, tobacco or illicit drugs 3. Their current medications and supplements 4. The patient's functional ability including ADL's, fall risks, home safety risks and hearing or visual             impairment. 5. Diet and physical activities 6. Evidence for depression or mood disorders  The patients weight, height, BMI have been recorded in the chart and visual acuity is per eye clinic.  I have made referrals, counseling and provided education to the patient based review of the above and I have provided the pt with a written personalized care plan for preventive services.  See scanned forms.  Routine anticipatory guidance given to patient.  See health maintenance. Tetanus 2004 Flu done yearly Colonoscopy 2010 Prostate cancer screening d/w pt.  PSA prev wnl.  Consider recheck PSA next year.  Advance directive.  He has a living will.  Daughter and sister are designated.    Elevated Cholesterol: Using medications without problems:yes Muscle aches: no Diet compliance: yes Exercise: some  Back pain.  Controlled with current meds. Taking as needed.  If less active, he needs less medication.  No ADE from current meds.  Mood is okay.  He gets frustrated as would be expected as he is motivated but limited by back pain.  He doesn't need refills.  Has f/u with Dr. Venetia Maxon this fall.    PMH and SH reviewed  Meds, vitals, and allergies reviewed.   ROS: See HPI.  Otherwise negative.    GEN: nad, alert and oriented HEENT: mucous membranes moist NECK: supple w/o LA CV: rrr. PULM: ctab, no inc wob ABD: soft, +bs EXT: no edema SKIN: no acute rash, old/healed scars on back noted

## 2012-05-23 NOTE — Assessment & Plan Note (Signed)
Controlled, continue current meds.   

## 2012-05-23 NOTE — Assessment & Plan Note (Signed)
Routine anticipatory guidance given to patient.  See health maintenance. Tetanus 2004 Flu done yearly Colonoscopy 2010 Prostate cancer screening d/w pt.  PSA prev wnl.  Consider recheck PSA next year.  Advance directive.  He has a living will.  Daughter and sister are designated.

## 2012-05-23 NOTE — Assessment & Plan Note (Signed)
Taking meds less often than rx'd usually.  Will follow clinically. No ADE and has f/u with spine clinic later in 2013.

## 2012-07-05 ENCOUNTER — Other Ambulatory Visit: Payer: Self-pay | Admitting: Family Medicine

## 2012-07-06 ENCOUNTER — Encounter: Payer: Self-pay | Admitting: Family Medicine

## 2012-07-06 ENCOUNTER — Other Ambulatory Visit: Payer: Self-pay | Admitting: *Deleted

## 2012-07-06 MED ORDER — ATORVASTATIN CALCIUM 40 MG PO TABS: 20.0000 mg | ORAL_TABLET | Freq: Every day | ORAL | Status: DC

## 2013-01-06 ENCOUNTER — Ambulatory Visit (INDEPENDENT_AMBULATORY_CARE_PROVIDER_SITE_OTHER): Payer: Medicare Other | Admitting: Family Medicine

## 2013-01-06 ENCOUNTER — Encounter: Payer: Self-pay | Admitting: Family Medicine

## 2013-01-06 VITALS — BP 108/76 | HR 88 | Temp 98.2°F | Wt 212.2 lb

## 2013-01-06 DIAGNOSIS — M549 Dorsalgia, unspecified: Secondary | ICD-10-CM

## 2013-01-06 DIAGNOSIS — N529 Male erectile dysfunction, unspecified: Secondary | ICD-10-CM | POA: Insufficient documentation

## 2013-01-06 MED ORDER — HYDROCODONE-ACETAMINOPHEN 10-325 MG PO TABS: 1.0000 | ORAL_TABLET | Freq: Three times a day (TID) | ORAL | Status: DC | PRN

## 2013-01-06 MED ORDER — ATORVASTATIN CALCIUM 20 MG PO TABS: 20.0000 mg | ORAL_TABLET | Freq: Every day | ORAL | Status: DC

## 2013-01-06 MED ORDER — SILDENAFIL CITRATE 100 MG PO TABS: 50.0000 mg | ORAL_TABLET | Freq: Every day | ORAL | Status: DC | PRN

## 2013-01-06 NOTE — Assessment & Plan Note (Signed)
Will dec to tid vicodin and he'll continue with routine care o/w.  He is doing well.

## 2013-01-06 NOTE — Progress Notes (Signed)
His back pain improved.  He had f/u with Dr. Venetia Maxon and didn't need f/u surgery.  He needs less pain medicine now.  Still with numbness in L foot, at baseline.    He's dating again was interested in viagra or similar.  He hasn't been sexually active in years.  Able to get AM erection.  No dysuria.  He was concerned about potentially having ED.  No CP, SOB, BLE edema.     Meds, vitals, and allergies reviewed.   ROS: See HPI.  Otherwise, noncontributory.  nad Ncat Mmm rrr ctab Back with old scars noted Dec in sensation to the L foot but o/w sensation intact to BLE

## 2013-01-06 NOTE — Assessment & Plan Note (Signed)
Possible, rx given for viagra with routine cautions.  F/u prn.

## 2013-01-06 NOTE — Patient Instructions (Addendum)
Use the viagra if needed and call with questions.  Take care.

## 2013-05-08 ENCOUNTER — Other Ambulatory Visit: Payer: Self-pay | Admitting: Family Medicine

## 2013-05-08 DIAGNOSIS — I10 Essential (primary) hypertension: Secondary | ICD-10-CM

## 2013-05-17 ENCOUNTER — Encounter: Payer: Self-pay | Admitting: Radiology

## 2013-05-18 ENCOUNTER — Other Ambulatory Visit (INDEPENDENT_AMBULATORY_CARE_PROVIDER_SITE_OTHER): Payer: Medicare Other

## 2013-05-18 DIAGNOSIS — I10 Essential (primary) hypertension: Secondary | ICD-10-CM

## 2013-05-18 LAB — COMPREHENSIVE METABOLIC PANEL
ALT: 24 U/L (ref 0–53)
Albumin: 3.7 g/dL (ref 3.5–5.2)
CO2: 27 mEq/L (ref 19–32)
Calcium: 8.9 mg/dL (ref 8.4–10.5)
Chloride: 109 mEq/L (ref 96–112)
Creatinine, Ser: 0.9 mg/dL (ref 0.4–1.5)
GFR: 91.63 mL/min (ref >60.00)
Sodium: 141 mEq/L (ref 135–145)
Total Protein: 6 g/dL (ref 6.0–8.3)

## 2013-05-20 ENCOUNTER — Other Ambulatory Visit: Payer: Medicare Other

## 2013-05-26 ENCOUNTER — Encounter: Payer: Self-pay | Admitting: Radiology

## 2013-05-27 ENCOUNTER — Ambulatory Visit (INDEPENDENT_AMBULATORY_CARE_PROVIDER_SITE_OTHER): Payer: Medicare Other | Admitting: Family Medicine

## 2013-05-27 ENCOUNTER — Encounter: Payer: Self-pay | Admitting: Family Medicine

## 2013-05-27 VITALS — BP 134/70 | HR 88 | Temp 98.0°F | Ht 77.0 in | Wt 214.0 lb

## 2013-05-27 DIAGNOSIS — E7800 Pure hypercholesterolemia, unspecified: Secondary | ICD-10-CM

## 2013-05-27 DIAGNOSIS — R7989 Other specified abnormal findings of blood chemistry: Secondary | ICD-10-CM

## 2013-05-27 DIAGNOSIS — Z23 Encounter for immunization: Secondary | ICD-10-CM

## 2013-05-27 DIAGNOSIS — M549 Dorsalgia, unspecified: Secondary | ICD-10-CM

## 2013-05-27 DIAGNOSIS — G4700 Insomnia, unspecified: Secondary | ICD-10-CM

## 2013-05-27 DIAGNOSIS — Z0000 Encounter for general adult medical examination without abnormal findings: Secondary | ICD-10-CM

## 2013-05-27 NOTE — Assessment & Plan Note (Signed)
Very rare use of pain meds.  Tolerating pain and will f/u with Dr. Venetia Maxon as scheduled.

## 2013-05-27 NOTE — Assessment & Plan Note (Signed)
Routine anticipatory guidance given to patient.  See health maintenance. Colonoscopy 2010 Prostate cancer screening and PSA options (with potential risks and benefits of testing vs not testing) were discussed along with recent recs/guidelines.  He declined testing PSA at this point. Tetanus 2014 Flu yearly, done at the hospital.  Diet and exercise discussed. Exercise is limited.  Living will d/w pt.  Daughter is designated if incapacitated.

## 2013-05-27 NOTE — Assessment & Plan Note (Signed)
Likely multifactorial and not to the point of wanting rx tx.  He'll update as needed.

## 2013-05-27 NOTE — Assessment & Plan Note (Signed)
Controlled, continue current meds.  No ADE on med.   

## 2013-05-27 NOTE — Assessment & Plan Note (Signed)
D/w pt and he'll work on diet.

## 2013-05-27 NOTE — Progress Notes (Signed)
CPE- See plan.  Routine anticipatory guidance given to patient.  See health maintenance. Colonoscopy 2010 Prostate cancer screening and PSA options (with potential risks and benefits of testing vs not testing) were discussed along with recent recs/guidelines.  He declined testing PSA at this point. Tetanus 2014 Flu yearly, done at the hospital.  Diet and exercise discussed. Exercise is limited.  Living will d/w pt.  Daughter is designated if incapacitated.    Back pain.  Off pain meds now, had been taking it rarely in the past few months.  He has seen Dr. Venetia Maxon every 6 months.  He has declined surgery at this point.  No new symptoms.   Elevated Cholesterol: Using medications without problems:yes Muscle aches: no Diet compliance:yes Exercise:limited by back pain.  Labs d/w pt.   Insomnia.  Would not want treatment at this point. He has failed OTC meds.   PMH and SH reviewed  Meds, vitals, and allergies reviewed.   ROS: See HPI.  Otherwise negative.    GEN: nad, alert and oriented HEENT: mucous membranes moist NECK: supple w/o LA CV: rrr. PULM: ctab, no inc wob ABD: soft, +bs EXT: no edema SKIN: no acute rash Lumbar back scar noted.

## 2013-05-27 NOTE — Patient Instructions (Addendum)
Go to the lab on the way out.  We'll contact you with your lab report. Take care. Call if you have concerns. Glad to see you.  I would get a flu shot each fall.

## 2013-08-25 ENCOUNTER — Other Ambulatory Visit: Payer: Self-pay

## 2013-09-11 ENCOUNTER — Encounter: Payer: Self-pay | Admitting: Family Medicine

## 2013-11-23 ENCOUNTER — Other Ambulatory Visit: Payer: Self-pay | Admitting: *Deleted

## 2013-11-23 MED ORDER — ATORVASTATIN CALCIUM 20 MG PO TABS: 20.0000 mg | ORAL_TABLET | Freq: Every day | ORAL | Status: DC

## 2013-12-19 ENCOUNTER — Encounter: Payer: Self-pay | Admitting: Family Medicine

## 2013-12-21 ENCOUNTER — Other Ambulatory Visit: Payer: Self-pay | Admitting: Family Medicine

## 2013-12-21 MED ORDER — TADALAFIL 20 MG PO TABS: 10.0000 mg | ORAL_TABLET | ORAL | Status: DC | PRN

## 2013-12-21 NOTE — Progress Notes (Signed)
Patient notified as instructed by telephone. Samples left up front for patient to pick up per Dr. Damita Dunnings.

## 2013-12-21 NOTE — Progress Notes (Signed)
Please call pt.  He can try cialis.  That may be a little cheaper.  All of these meds are expensive.  I'll check to see if we have samples.  Take 10mg  initially.  Don't take this dose daily.  rx sent locally.  Thanks.

## 2013-12-23 ENCOUNTER — Encounter: Payer: Self-pay | Admitting: Family Medicine

## 2013-12-24 ENCOUNTER — Other Ambulatory Visit: Payer: Self-pay | Admitting: Family Medicine

## 2013-12-24 MED ORDER — TADALAFIL 5 MG PO TABS: 5.0000 mg | ORAL_TABLET | Freq: Every day | ORAL | Status: DC | PRN

## 2014-05-20 ENCOUNTER — Other Ambulatory Visit: Payer: Self-pay | Admitting: Family Medicine

## 2014-05-20 DIAGNOSIS — E7800 Pure hypercholesterolemia, unspecified: Secondary | ICD-10-CM

## 2014-05-26 ENCOUNTER — Other Ambulatory Visit (INDEPENDENT_AMBULATORY_CARE_PROVIDER_SITE_OTHER): Payer: Medicare Other

## 2014-05-26 DIAGNOSIS — E7800 Pure hypercholesterolemia, unspecified: Secondary | ICD-10-CM

## 2014-05-26 LAB — LIPID PANEL
CHOL/HDL RATIO: 3
Cholesterol: 145 mg/dL (ref 0–200)
HDL: 50.3 mg/dL (ref >39.00)
LDL Cholesterol: 85 mg/dL (ref 0–99)
NonHDL: 94.7
TRIGLYCERIDES: 50 mg/dL (ref 0.0–149.0)
VLDL: 10 mg/dL (ref 0.0–40.0)

## 2014-05-26 LAB — COMPREHENSIVE METABOLIC PANEL
ALK PHOS: 56 U/L (ref 39–117)
ALT: 23 U/L (ref 0–53)
AST: 21 U/L (ref 0–37)
Albumin: 4.6 g/dL (ref 3.5–5.2)
BILIRUBIN TOTAL: 0.8 mg/dL (ref 0.2–1.2)
BUN: 14 mg/dL (ref 6–23)
CALCIUM: 9.4 mg/dL (ref 8.4–10.5)
CHLORIDE: 105 meq/L (ref 96–112)
CO2: 27 mEq/L (ref 19–32)
CREATININE: 0.9 mg/dL (ref 0.4–1.5)
GFR: 96.16 mL/min (ref >60.00)
Glucose, Bld: 97 mg/dL (ref 70–99)
Potassium: 4.6 mEq/L (ref 3.5–5.1)
Sodium: 140 mEq/L (ref 135–145)
Total Protein: 7.4 g/dL (ref 6.0–8.3)

## 2014-05-30 ENCOUNTER — Encounter: Payer: Medicare Other | Admitting: Family Medicine

## 2014-06-01 ENCOUNTER — Encounter: Payer: Self-pay | Admitting: Family Medicine

## 2014-06-01 ENCOUNTER — Ambulatory Visit (INDEPENDENT_AMBULATORY_CARE_PROVIDER_SITE_OTHER): Payer: Medicare Other | Admitting: Family Medicine

## 2014-06-01 VITALS — BP 122/74 | HR 71 | Temp 98.0°F | Ht 77.0 in | Wt 207.2 lb

## 2014-06-01 DIAGNOSIS — Z7189 Other specified counseling: Secondary | ICD-10-CM | POA: Insufficient documentation

## 2014-06-01 DIAGNOSIS — B029 Zoster without complications: Secondary | ICD-10-CM | POA: Insufficient documentation

## 2014-06-01 DIAGNOSIS — M545 Low back pain, unspecified: Secondary | ICD-10-CM

## 2014-06-01 DIAGNOSIS — Z0000 Encounter for general adult medical examination without abnormal findings: Secondary | ICD-10-CM

## 2014-06-01 DIAGNOSIS — N529 Male erectile dysfunction, unspecified: Secondary | ICD-10-CM

## 2014-06-01 DIAGNOSIS — E7800 Pure hypercholesterolemia, unspecified: Secondary | ICD-10-CM

## 2014-06-01 MED ORDER — CELECOXIB 200 MG PO CAPS: 200.0000 mg | ORAL_CAPSULE | Freq: Two times a day (BID) | ORAL | Status: DC

## 2014-06-01 MED ORDER — HYDROCODONE-ACETAMINOPHEN 10-325 MG PO TABS: 1.0000 | ORAL_TABLET | Freq: Three times a day (TID) | ORAL | Status: DC | PRN

## 2014-06-01 MED ORDER — TADALAFIL 5 MG PO TABS
2.5000 mg | ORAL_TABLET | Freq: Every day | ORAL | Status: DC | PRN
Start: 2014-06-01 — End: 2014-08-15

## 2014-06-01 MED ORDER — ATORVASTATIN CALCIUM 20 MG PO TABS: 20.0000 mg | ORAL_TABLET | Freq: Every day | ORAL | Status: DC

## 2014-06-01 NOTE — Assessment & Plan Note (Signed)
Flu 2014 Shingles d/w pt.  Not yet due PNA not due Tetanus 2014 Colonoscopy 2010 Prostate cancer screening and PSA options (with potential risks and benefits of testing vs not testing) were discussed along with recent recs/guidelines.  He declined testing PSA at this point. Advance directive= wife [name] designated if patient were incapacitated.   Cognitive function addressed- see scanned forms- and if abnormal then additional documentation follows.

## 2014-06-01 NOTE — Assessment & Plan Note (Signed)
Controlled, labs d/w pt.  Continue statin.  He agrees.

## 2014-06-01 NOTE — Assessment & Plan Note (Signed)
Advance directive= wife [name] designated if patient were incapacitated.

## 2014-06-01 NOTE — Assessment & Plan Note (Signed)
Continue prn hydrocodone, not used daily.  Continue stretching, f/u prn.

## 2014-06-01 NOTE — Progress Notes (Signed)
Pre visit review using our clinic review tool, if applicable. No additional management support is needed unless otherwise documented below in the visit note.  I have personally reviewed the Medicare Annual Wellness questionnaire and have noted 1. The patient's medical and social history 2. Their use of alcohol, tobacco or illicit drugs 3. Their current medications and supplements 4. The patient's functional ability including ADL's, fall risks, home safety risks and hearing or visual             impairment. 5. Diet and physical activities 6. Evidence for depression or mood disorders  The patients weight, height, BMI have been recorded in the chart and visual acuity is per eye clinic.  I have made referrals, counseling and provided education to the patient based review of the above and I have provided the pt with a written personalized care plan for preventive services.  Provider list updated- see scanned forms.  Routine anticipatory guidance given to patient.  See health maintenance.  Flu 2014 Shingles d/w pt.  Not yet due PNA not due Tetanus 2014 Colonoscopy 2010 Prostate cancer screening and PSA options (with potential risks and benefits of testing vs not testing) were discussed along with recent recs/guidelines.  He declined testing PSA at this point. Advance directive= wife [name] designated if patient were incapacitated.   Cognitive function addressed- see scanned forms- and if abnormal then additional documentation follows.   Shingles likely on R upper back. Painful itchy rash prev noted, dermatomal, started on valtrex in the meantime.   ED. D/w pt about cialis.  Some effect noted, okay to cut back to 2.5mg  a day.   Back pain- had been stretching his meds to a few pills a week.  Prev with surgeries noted, spinal cord stimulator isn't working well.  He had taking some celebrex and that helped some, wanted to restart.    Elevated Cholesterol: Using medications without  problems:yes Muscle aches: not likely from statin Diet compliance:yes Exercise:yes  PMH and SH reviewed  Meds, vitals, and allergies reviewed.   ROS: See HPI.  Otherwise negative.    GEN: nad, alert and oriented HEENT: mucous membranes moist NECK: supple w/o LA CV: rrr. PULM: ctab, no inc wob ABD: soft, +bs EXT: no edema SKIN: no acute rash but 2 resolved small ulcerated lesion on the R upper back, dermatomal distribution.   R lower back ttp with muscle spasms as expected.

## 2014-06-01 NOTE — Assessment & Plan Note (Signed)
Okay to cut cialis to 2.5 mg per dose. F/u prn.

## 2014-06-01 NOTE — Patient Instructions (Signed)
Try the celebrex for your back pain. If the shingles pain gets worse, then let me know.  Take care. Glad to see you.

## 2014-06-01 NOTE — Assessment & Plan Note (Signed)
Finish valtrex and notify me if the pain continues, increases.  He agrees.

## 2014-06-02 ENCOUNTER — Encounter: Payer: Medicare Other | Admitting: Family Medicine

## 2014-06-07 ENCOUNTER — Other Ambulatory Visit: Payer: Self-pay | Admitting: Family Medicine

## 2014-06-19 ENCOUNTER — Encounter: Payer: Self-pay | Admitting: Family Medicine

## 2014-07-04 ENCOUNTER — Encounter: Payer: Self-pay | Admitting: Family Medicine

## 2014-07-21 ENCOUNTER — Encounter: Payer: Self-pay | Admitting: Gastroenterology

## 2014-08-09 ENCOUNTER — Encounter: Payer: Self-pay | Admitting: Family Medicine

## 2014-08-09 ENCOUNTER — Telehealth: Payer: Self-pay | Admitting: Family Medicine

## 2014-08-09 NOTE — Telephone Encounter (Signed)
See my chart message.  Please start the PA on cialis. Thanks.

## 2014-08-11 NOTE — Telephone Encounter (Signed)
Paperwork in your IN box for completion

## 2014-08-13 NOTE — Telephone Encounter (Signed)
Form done, thanks ?

## 2014-08-14 NOTE — Telephone Encounter (Signed)
Completed form faxed to [facility] as instructed.

## 2014-08-15 MED ORDER — TADALAFIL 5 MG PO TABS: 2.5000 mg | ORAL_TABLET | Freq: Every day | ORAL | Status: DC | PRN

## 2014-08-15 NOTE — Telephone Encounter (Signed)
Patient notified as instructed by telephone. Patient stated that he would like to have a written script for #30 to shop around and get it filled and he will pay out of pocket for the medication. Patient requested a call back when script is ready for pickup.

## 2014-08-15 NOTE — Addendum Note (Signed)
Addended by: Tonia Ghent on: 08/15/2014 01:50 PM   Modules accepted: Orders

## 2014-08-15 NOTE — Telephone Encounter (Signed)
Received denial letter from [facility], letter in your inbox.

## 2014-08-15 NOTE — Telephone Encounter (Signed)
Printed.  Thanks.  

## 2014-08-15 NOTE — Telephone Encounter (Signed)
Noted, notify pt.  Thanks.  

## 2014-08-15 NOTE — Telephone Encounter (Signed)
Patient notified that script is up front ready for pickup.

## 2014-10-09 ENCOUNTER — Encounter: Payer: Self-pay | Admitting: Family Medicine

## 2014-10-09 ENCOUNTER — Other Ambulatory Visit: Payer: Self-pay | Admitting: Family Medicine

## 2014-10-09 NOTE — Telephone Encounter (Signed)
Electronic refill request. Last Filled:    90 capsule 1 RF on 06/01/2014  Please advise.

## 2014-10-10 ENCOUNTER — Other Ambulatory Visit: Payer: Self-pay | Admitting: Family Medicine

## 2014-10-10 MED ORDER — SILDENAFIL CITRATE 20 MG PO TABS: 40.0000 mg | ORAL_TABLET | Freq: Every day | ORAL | Status: DC | PRN

## 2014-10-10 NOTE — Telephone Encounter (Signed)
Sent. Thanks.   

## 2014-10-24 ENCOUNTER — Encounter: Payer: Self-pay | Admitting: Gastroenterology

## 2014-10-31 ENCOUNTER — Encounter: Payer: Self-pay | Admitting: Gastroenterology

## 2014-12-12 ENCOUNTER — Ambulatory Visit (AMBULATORY_SURGERY_CENTER): Payer: Self-pay | Admitting: *Deleted

## 2014-12-12 VITALS — Ht 77.0 in | Wt 218.2 lb

## 2014-12-12 DIAGNOSIS — Z800 Family history of malignant neoplasm of digestive organs: Secondary | ICD-10-CM

## 2014-12-12 MED ORDER — NA SULFATE-K SULFATE-MG SULF 17.5-3.13-1.6 GM/177ML PO SOLN: 1.0000 | Freq: Once | ORAL | Status: DC

## 2014-12-12 NOTE — Progress Notes (Signed)
No egg or soy allergy  no issues with past sedation No diet pills No home 02 use Pt declined emmi video

## 2014-12-26 ENCOUNTER — Encounter: Payer: Self-pay | Admitting: Gastroenterology

## 2014-12-26 ENCOUNTER — Ambulatory Visit (AMBULATORY_SURGERY_CENTER): Payer: Medicare Other | Admitting: Gastroenterology

## 2014-12-26 VITALS — BP 123/80 | HR 66 | Temp 97.2°F | Resp 16 | Ht 77.0 in | Wt 218.0 lb

## 2014-12-26 DIAGNOSIS — Z800 Family history of malignant neoplasm of digestive organs: Secondary | ICD-10-CM

## 2014-12-26 DIAGNOSIS — Z1211 Encounter for screening for malignant neoplasm of colon: Secondary | ICD-10-CM

## 2014-12-26 DIAGNOSIS — K5730 Diverticulosis of large intestine without perforation or abscess without bleeding: Secondary | ICD-10-CM

## 2014-12-26 MED ORDER — SODIUM CHLORIDE 0.9 % IV SOLN: 500.0000 mL | INTRAVENOUS | Status: DC

## 2014-12-26 NOTE — Patient Instructions (Signed)
YOU HAD AN ENDOSCOPIC PROCEDURE TODAY AT [facility]:   Refer to the procedure report that was given to you for any specific questions about what was found during the examination.  If the procedure report does not answer your questions, please call your gastroenterologist to clarify.  If you requested that your care partner not be given the details of your procedure findings, then the procedure report has been included in a sealed envelope for you to review at your convenience later.  YOU SHOULD EXPECT: Some feelings of bloating in the abdomen. Passage of more gas than usual.  Walking can help get rid of the air that was put into your GI tract during the procedure and reduce the bloating. If you had a lower endoscopy (such as a colonoscopy or flexible sigmoidoscopy) you may notice spotting of blood in your stool or on the toilet paper. If you underwent a bowel prep for your procedure, you may not have a normal bowel movement for a few days.  Please Note:  You might notice some irritation and congestion in your nose or some drainage.  This is from the oxygen used during your procedure.  There is no need for concern and it should clear up in a day or so.  SYMPTOMS TO REPORT IMMEDIATELY:   Following lower endoscopy (colonoscopy or flexible sigmoidoscopy):  Excessive amounts of blood in the stool  Significant tenderness or worsening of abdominal pains  Swelling of the abdomen that is new, acute  Fever of 100F or higher  For urgent or emergent issues, a gastroenterologist can be reached at any hour by calling [phone].  DIET: Your first meal following the procedure should be a small meal and then it is ok to progress to your normal diet. Heavy or fried foods are harder to digest and may make you feel nauseous or bloated.  Likewise, meals heavy in dairy and vegetables can increase bloating.  Drink plenty of fluids but you should avoid alcoholic beverages for 24 hours.  ACTIVITY:   You should plan to take it easy for the rest of today and you should NOT DRIVE or use heavy machinery until tomorrow (because of the sedation medicines used during the test).    FOLLOW UP: Our staff will call the number listed on your records the next business day following your procedure to check on you and address any questions or concerns that you may have regarding the information given to you following your procedure. If we do not reach you, we will leave a message.  However, if you are feeling well and you are not experiencing any problems, there is no need to return our call.  We will assume that you have returned to your regular daily activities without incident.  SIGNATURES/CONFIDENTIALITY: You and/or your care partner have signed paperwork which will be entered into your electronic medical record.  These signatures attest to the fact that that the information above on your After Visit Summary has been reviewed and is understood.  Full responsibility of the confidentiality of this discharge information lies with you and/or your care-partner.  Continue your normal medications  Please read over handouts about diverticulosis and high fiber diets  Next colonoscopy- 5 years

## 2014-12-26 NOTE — Progress Notes (Signed)
Report to PACU, RN, vss, BBS= Clear.  

## 2014-12-26 NOTE — Op Note (Signed)
[place]  [address] [place], [id number]   COLONOSCOPY PROCEDURE REPORT  PATIENT: Tim, Corriher  MR#: [id number] BIRTHDATE: 07-21-1956 , 28  yrs. old GENDER: male ENDOSCOPIST: Inda Castle, MD REFERRED DU:KGURKY Duncan, M.D. PROCEDURE DATE:  12/26/2014 PROCEDURE:   Colonoscopy, screening First Screening Colonoscopy - Avg.  risk and is 50 yrs.  old or older - No.  Prior Negative Screening - Now for repeat screening. Above average risk  History of Adenoma - Now for follow-up colonoscopy & has been > or = to 3 yrs.  N/A  Polyps Removed Today? No.  Recommend repeat exam, <10 yrs? Yes.  High risk (family or personal hx). ASA CLASS:   Class II INDICATIONS:FH Colon or Rectal Adenocarcinoma. MEDICATIONS: Monitored anesthesia care and Propofol 240 mg IV  DESCRIPTION OF PROCEDURE:   After the risks benefits and alternatives of the procedure were thoroughly explained, informed consent was obtained.  The digital rectal exam revealed no abnormalities of the rectum.   The LB [id number]  endoscope was introduced through the anus and advanced to the cecum, which was identified by both the appendix and ileocecal valve. No adverse events experienced.   The quality of the prep was adequate (Suprep was used)  The instrument was then slowly withdrawn as the colon was fully examined.      COLON FINDINGS: There was mild diverticulosis noted at the cecum and in the ascending colon.   The examination was otherwise normal. Retroflexed views revealed no abnormalities. The time to cecum = 8.7 Withdrawal time = 7.1   The scope was withdrawn and the procedure completed. COMPLICATIONS: There were no immediate complications.  ENDOSCOPIC IMPRESSION: 1.   There was mild diverticulosis noted at the cecum and in the ascending colon 2.   The examination was otherwise normal  RECOMMENDATIONS: Given your significant family history of colon cancer, you should have a repeat  colonoscopy in 5 years  eSigned:  Inda Castle, MD 12/26/2014 11:19 AM   cc:   PATIENT NAME:  Carsyn, Taubman MR#: [id number]

## 2014-12-27 ENCOUNTER — Telehealth: Payer: Self-pay | Admitting: *Deleted

## 2014-12-27 NOTE — Telephone Encounter (Signed)
  Follow up Call-  Call back number 12/26/2014  Post procedure Call Back phone  # [phone]  Permission to leave phone message Yes     Patient questions:  Do you have a fever, pain , or abdominal swelling? No. Pain Score  0 *  Have you tolerated food without any problems? Yes.    Have you been able to return to your normal activities? Yes.    Do you have any questions about your discharge instructions: Diet   No. Medications  No. Follow up visit  No.  Do you have questions or concerns about your Care? No.  Actions: * If pain score is 4 or above: No action needed, pain <4.

## 2015-05-25 ENCOUNTER — Other Ambulatory Visit: Payer: Self-pay | Admitting: Family Medicine

## 2015-05-25 DIAGNOSIS — E7800 Pure hypercholesterolemia, unspecified: Secondary | ICD-10-CM

## 2015-05-31 ENCOUNTER — Other Ambulatory Visit (INDEPENDENT_AMBULATORY_CARE_PROVIDER_SITE_OTHER): Payer: Medicare Other

## 2015-05-31 DIAGNOSIS — E7800 Pure hypercholesterolemia, unspecified: Secondary | ICD-10-CM

## 2015-05-31 LAB — LIPID PANEL
CHOL/HDL RATIO: 3
Cholesterol: 141 mg/dL (ref 0–200)
HDL: 42.2 mg/dL (ref >39.00)
LDL CALC: 86 mg/dL (ref 0–99)
NONHDL: 99.01
TRIGLYCERIDES: 67 mg/dL (ref 0.0–149.0)
VLDL: 13.4 mg/dL (ref 0.0–40.0)

## 2015-05-31 LAB — COMPREHENSIVE METABOLIC PANEL
ALBUMIN: 4.5 g/dL (ref 3.5–5.2)
ALT: 24 U/L (ref 0–53)
AST: 17 U/L (ref 0–37)
Alkaline Phosphatase: 59 U/L (ref 39–117)
BUN: 14 mg/dL (ref 6–23)
CHLORIDE: 106 meq/L (ref 96–112)
CO2: 30 meq/L (ref 19–32)
Calcium: 9.5 mg/dL (ref 8.4–10.5)
Creatinine, Ser: 0.87 mg/dL (ref 0.40–1.50)
GFR: 95.81 mL/min (ref >60.00)
Glucose, Bld: 107 mg/dL — ABNORMAL HIGH (ref 70–99)
POTASSIUM: 4.6 meq/L (ref 3.5–5.1)
Sodium: 142 mEq/L (ref 135–145)
TOTAL PROTEIN: 7.1 g/dL (ref 6.0–8.3)
Total Bilirubin: 0.5 mg/dL (ref 0.2–1.2)

## 2015-06-07 ENCOUNTER — Encounter: Payer: Self-pay | Admitting: Family Medicine

## 2015-06-07 ENCOUNTER — Ambulatory Visit (INDEPENDENT_AMBULATORY_CARE_PROVIDER_SITE_OTHER): Payer: Medicare Other | Admitting: Family Medicine

## 2015-06-07 VITALS — BP 114/66 | HR 69 | Temp 98.3°F | Ht 76.0 in | Wt 209.2 lb

## 2015-06-07 DIAGNOSIS — Z0000 Encounter for general adult medical examination without abnormal findings: Secondary | ICD-10-CM

## 2015-06-07 DIAGNOSIS — E7800 Pure hypercholesterolemia, unspecified: Secondary | ICD-10-CM

## 2015-06-07 DIAGNOSIS — M545 Low back pain: Secondary | ICD-10-CM

## 2015-06-07 DIAGNOSIS — Z119 Encounter for screening for infectious and parasitic diseases, unspecified: Secondary | ICD-10-CM

## 2015-06-07 DIAGNOSIS — Z7189 Other specified counseling: Secondary | ICD-10-CM

## 2015-06-07 MED ORDER — TRAMADOL HCL 50 MG PO TABS: 50.0000 mg | ORAL_TABLET | Freq: Three times a day (TID) | ORAL | Status: DC | PRN

## 2015-06-07 MED ORDER — CELECOXIB 200 MG PO CAPS: ORAL_CAPSULE | ORAL | Status: DC

## 2015-06-07 MED ORDER — ATORVASTATIN CALCIUM 20 MG PO TABS: 20.0000 mg | ORAL_TABLET | Freq: Every day | ORAL | Status: DC

## 2015-06-07 MED ORDER — SILDENAFIL CITRATE 20 MG PO TABS: 40.0000 mg | ORAL_TABLET | Freq: Every day | ORAL | Status: DC | PRN

## 2015-06-07 NOTE — Progress Notes (Signed)
Pre visit review using our clinic review tool, if applicable. No additional management support is needed unless otherwise documented below in the visit note.  I have personally reviewed the Medicare Annual Wellness questionnaire and have noted 1. The patient's medical and social history 2. Their use of alcohol, tobacco or illicit drugs 3. Their current medications and supplements 4. The patient's functional ability including ADL's, fall risks, home safety risks and hearing or visual             impairment. 5. Diet and physical activities 6. Evidence for depression or mood disorders  The patients weight, height, BMI have been recorded in the chart and visual acuity is per eye clinic.  I have made referrals, counseling and provided education to the patient based review of the above and I have provided the pt with a written personalized care plan for preventive services.  Provider list updated- see scanned forms.  Routine anticipatory guidance given to patient.  See health maintenance.  Flu encouraged Shingles not due PNA not due Tetanus 2014 Colonoscopy 2016 Prostate cancer screening and PSA options (with potential risks and benefits of testing vs not testing) were discussed along with recent recs/guidelines.  He declined testing PSA at this point. Advance directive- wife designated if patient were incapacitated.  Cognitive function addressed- see scanned forms- and if abnormal then additional documentation follows.  D/w pt about HIV and HCV screening, we can do with next year's labs, ie 2017.    Back pain.  Rare use of hydrocodone.  Can get constipated with med.  Asking about trial of tramadol.  Compliant with stretching.  Still with baseline pain, "putting up with it."  No new sx.  Still with baseline leg sx, no new weakness.   Elevated Cholesterol: Using medications without problems:yes Muscle aches: not likely from statin Diet compliance:yes Exercise:as tolerated.   PMH and SH  reviewed  Meds, vitals, and allergies reviewed.   ROS: See HPI.  Otherwise negative.    GEN: nad, alert and oriented HEENT: mucous membranes moist NECK: supple w/o LA CV: rrr. PULM: ctab, no inc wob ABD: soft, +bs EXT: no edema SKIN: no acute rash L foot numbness at baseline

## 2015-06-07 NOTE — Patient Instructions (Signed)
Try the tramadol and let me know if that helps.   Take care.  Glad to see you.

## 2015-06-08 NOTE — Assessment & Plan Note (Signed)
Rare use of hydrocodone.  Can get constipated with med.  Asking about trial of tramadol.  Compliant with stretching.  Still with baseline pain, "putting up with it."  No new sx.  Still with baseline leg sx, no new weakness. Reasonable for trial of tramadol.  Routine cautions.  He agrees.  Update me as needed.

## 2015-06-08 NOTE — Assessment & Plan Note (Signed)
Controlled, continue as is.  Labs d/w pt.  See above.

## 2015-06-08 NOTE — Assessment & Plan Note (Signed)
Flu encouraged Shingles not due PNA not due Tetanus 2014 Colonoscopy 2016 Prostate cancer screening and PSA options (with potential risks and benefits of testing vs not testing) were discussed along with recent recs/guidelines.  He declined testing PSA at this point. Advance directive- wife designated if patient were incapacitated.  Cognitive function addressed- see scanned forms- and if abnormal then additional documentation follows.  D/w pt about HIV and HCV screening, we can do with next year's labs, ie 2017.

## 2015-06-13 ENCOUNTER — Encounter: Payer: Self-pay | Admitting: Family Medicine

## 2015-06-13 ENCOUNTER — Ambulatory Visit (INDEPENDENT_AMBULATORY_CARE_PROVIDER_SITE_OTHER): Payer: Medicare Other | Admitting: Family Medicine

## 2015-06-13 VITALS — BP 120/78 | HR 70 | Temp 98.5°F | Wt 212.8 lb

## 2015-06-13 DIAGNOSIS — R1031 Right lower quadrant pain: Secondary | ICD-10-CM | POA: Diagnosis not present

## 2015-06-13 DIAGNOSIS — M545 Low back pain: Secondary | ICD-10-CM | POA: Diagnosis not present

## 2015-06-13 LAB — POCT URINALYSIS DIPSTICK
Bilirubin, UA: NEGATIVE
Blood, UA: NEGATIVE
Glucose, UA: NEGATIVE
Ketones, UA: NEGATIVE
Leukocytes, UA: NEGATIVE
Nitrite, UA: NEGATIVE
Protein, UA: NEGATIVE
Spec Grav, UA: 1.025
Urobilinogen, UA: 0.2
pH, UA: 6

## 2015-06-13 MED ORDER — METRONIDAZOLE 500 MG PO TABS
500.0000 mg | ORAL_TABLET | Freq: Three times a day (TID) | ORAL | Status: DC
Start: 2015-06-13 — End: 2016-03-26

## 2015-06-13 MED ORDER — CIPROFLOXACIN HCL 500 MG PO TABS: 500.0000 mg | ORAL_TABLET | Freq: Two times a day (BID) | ORAL | Status: DC

## 2015-06-13 NOTE — Assessment & Plan Note (Addendum)
Nontoxic, but some of his pain may be masked by pain meds.  D/w pt.  Still okay for outpatient f/u.  Minimal abd tenderness and not obstructed with BM this AM.  No emergent sx.   Check u/a and CBC today.  U/a neg, d/w pt.  Presumed diverticulitis, will start cipro/flagyl.  Given h/o R sided diverticulitis, would be reasonable to treat w/o CT scan, d/w pt.  He agrees.  He can update me as needed.  >25 minutes spent in face to face time with patient, >50% spent in counselling or coordination of care.

## 2015-06-13 NOTE — Assessment & Plan Note (Signed)
Tramadol is helping his back pain. Needing less hydrocodone. No ADE on tramadol. He did have take a hydrocodone for the pain today and that may be masking some of the pain on his exam today. D/w pt.

## 2015-06-13 NOTE — Progress Notes (Signed)
Pre visit review using our clinic review tool, if applicable. No additional management support is needed unless otherwise documented below in the visit note.  H/o R sided diverticulitis prev, years ago.  H/o appendectomy years ago.  He started having abd pain, RLQ, Monday night.  He thought it was gas initially.  It had resolved and then returned in the meantime.  Normal BM yesterday and today.  He has some R testicle pain, but he thought it was radiating down to the testicle and not originating there.  No L sided sx.  No pain now but had a sharp RLQ this AM.  No FCNAVD.  No blood in stool.  No dysuria.  No h/o renal stones.  Back pain is at baseline.  Appetite is okay unless he is in pain.    As an aside, tramadol is helping his back pain.  Needing less hydrocodone.  No ADE on tramadol.  He did have take a hydrocodone for the pain today and that may be masking some of the pain on his exam today.  D/w pt.    Meds, vitals, and allergies reviewed.   ROS: See HPI.  Otherwise, noncontributory.  nad ncat rrr ctab abd soft, normal BS, not ttp except for slightly ttp in the RLQ w/o rebound.  Testes bilaterally descended without nodularity, tenderness or masses. No scrotal masses or lesions. No penis lesions or urethral discharge. No hernia on groin exam.

## 2015-06-13 NOTE — Patient Instructions (Addendum)
Go to the lab on the way out.  We'll contact you with your lab report. Likely diverticulitis.  Start cipro and flagyl today.  Limit fiber for now- clear liquid diet for the next 1-2 days, with slow advancement as tolerated.   Update me as needed, certainly if not better.  Take care. Glad to see you.

## 2015-06-13 NOTE — Addendum Note (Signed)
Addended by: Marchia Bond on: 06/13/2015 03:10 PM   Modules accepted: Orders

## 2015-06-14 LAB — CBC WITH DIFFERENTIAL/PLATELET
BASOS ABS: 0 10*3/uL (ref 0.0–0.1)
Basophils Relative: 0.3 % (ref 0.0–3.0)
EOS ABS: 0 10*3/uL (ref 0.0–0.7)
Eosinophils Relative: 0.3 % (ref 0.0–5.0)
HEMATOCRIT: 46.3 % (ref 39.0–52.0)
Hemoglobin: 15.6 g/dL (ref 13.0–17.0)
LYMPHS PCT: 18.8 % (ref 12.0–46.0)
Lymphs Abs: 2.1 10*3/uL (ref 0.7–4.0)
MCHC: 33.8 g/dL (ref 30.0–36.0)
MCV: 92.1 fl (ref 78.0–100.0)
Monocytes Absolute: 0.7 10*3/uL (ref 0.1–1.0)
Monocytes Relative: 6.2 % (ref 3.0–12.0)
Neutro Abs: 8.3 10*3/uL — ABNORMAL HIGH (ref 1.4–7.7)
Neutrophils Relative %: 74.4 % (ref 43.0–77.0)
Platelets: 115 10*3/uL — ABNORMAL LOW (ref 150.0–400.0)
RBC: 5.03 Mil/uL (ref 4.22–5.81)
RDW: 13.4 % (ref 11.5–15.5)
WBC: 11.1 10*3/uL — AB (ref 4.0–10.5)

## 2015-06-15 ENCOUNTER — Encounter: Payer: Self-pay | Admitting: Family Medicine

## 2016-01-12 ENCOUNTER — Encounter: Payer: Self-pay | Admitting: Family Medicine

## 2016-01-15 ENCOUNTER — Other Ambulatory Visit: Payer: Self-pay | Admitting: Family Medicine

## 2016-01-15 MED ORDER — TRAMADOL HCL 50 MG PO TABS: 50.0000 mg | ORAL_TABLET | Freq: Three times a day (TID) | ORAL | Status: DC | PRN

## 2016-01-15 NOTE — Progress Notes (Signed)
Medication phoned to pharmacy.  

## 2016-01-15 NOTE — Progress Notes (Signed)
Please call in tramadol Thanks 

## 2016-02-26 ENCOUNTER — Other Ambulatory Visit: Payer: Self-pay | Admitting: Family Medicine

## 2016-03-05 ENCOUNTER — Encounter: Payer: Self-pay | Admitting: Family Medicine

## 2016-03-26 ENCOUNTER — Encounter: Payer: Self-pay | Admitting: Family Medicine

## 2016-03-26 ENCOUNTER — Ambulatory Visit (INDEPENDENT_AMBULATORY_CARE_PROVIDER_SITE_OTHER): Payer: Medicare Other | Admitting: Family Medicine

## 2016-03-26 ENCOUNTER — Ambulatory Visit (INDEPENDENT_AMBULATORY_CARE_PROVIDER_SITE_OTHER): Payer: Medicare Other

## 2016-03-26 VITALS — BP 112/70 | HR 85 | Temp 97.8°F | Ht 76.0 in | Wt 210.5 lb

## 2016-03-26 DIAGNOSIS — E785 Hyperlipidemia, unspecified: Secondary | ICD-10-CM

## 2016-03-26 DIAGNOSIS — Z7189 Other specified counseling: Secondary | ICD-10-CM

## 2016-03-26 DIAGNOSIS — Z0000 Encounter for general adult medical examination without abnormal findings: Secondary | ICD-10-CM | POA: Diagnosis not present

## 2016-03-26 DIAGNOSIS — E7800 Pure hypercholesterolemia, unspecified: Secondary | ICD-10-CM

## 2016-03-26 DIAGNOSIS — M545 Low back pain: Secondary | ICD-10-CM

## 2016-03-26 DIAGNOSIS — Z125 Encounter for screening for malignant neoplasm of prostate: Secondary | ICD-10-CM | POA: Diagnosis not present

## 2016-03-26 MED ORDER — TRAMADOL HCL 50 MG PO TABS: 50.0000 mg | ORAL_TABLET | Freq: Three times a day (TID) | ORAL | Status: DC | PRN

## 2016-03-26 NOTE — Progress Notes (Signed)
Pre visit review using our clinic review tool, if applicable. No additional management support is needed unless otherwise documented below in the visit note. 

## 2016-03-26 NOTE — Progress Notes (Signed)
Subjective:   Andrew [name] is a 60 y.o. male who presents for Medicare Annual/Subsequent preventive examination.  Review of Systems:  N/A Cardiac Risk Factors include: advanced age (>32men, >9 women);male gender;dyslipidemia     Objective:    Vitals: BP 112/70 mmHg  Pulse 85  Temp(Src) 97.8 F (36.6 C) (Oral)  Ht 6\' 4"  (1.93 m)  Wt 210 lb 8 oz (95.482 kg)  BMI 25.63 kg/m2  SpO2 97%  Body mass index is 25.63 kg/(m^2).  Tobacco History  Smoking status  . Former Smoker  Smokeless tobacco  . Never Used     Counseling given: No   Past Medical History  Diagnosis Date  . Back pain     With spinal cord stimulator.  S/p lumbar fusion, diskectomy.  Prev seen by Dr. Vertell Limber   . Hyperlipidemia   . Diverticulosis   . Leg paresthesia     L foot numb- since 1999 surgery  . Erectile dysfunction   . Arthritis   . Diverticulitis    Past Surgical History  Procedure Laterality Date  . Laminectomy       L4/5 1975,  L5/S1 1990  . Back surgery      Lumbar back surgery L3/4 ans L5/S1 rods and screws L3-S1 (Dr Carloyn Manner) 02/29/2008  . Spinal cord stimulator implant    . Appendectomy     Family History  Problem Relation Age of Onset  . Alzheimer's disease Mother   . Dementia Mother   . Heart disease Father   . Hyperlipidemia Father   . Dementia Father   . Colon cancer Brother   . Prostate cancer Neg Hx   . Esophageal cancer Neg Hx   . Rectal cancer Neg Hx   . Stomach cancer Neg Hx    History  Sexual Activity  . Sexual Activity: Yes    Outpatient Encounter Prescriptions as of 03/26/2016  Medication Sig  . atorvastatin (LIPITOR) 20 MG tablet Take 1 tablet by mouth  daily  . celecoxib (CELEBREX) 200 MG capsule Take 1 capsule by mouth two times daily  . docusate sodium (COLACE) 100 MG capsule Take 100 mg by mouth 2 (two) times daily.    . Omega-3 Fatty Acids (FISH OIL) 1200 MG CAPS Take 1,200 mg by mouth daily.  . sildenafil (REVATIO) 20 MG tablet Take 2-3 tablets (40-60 mg  total) by mouth daily as needed.  . traMADol (ULTRAM) 50 MG tablet Take 1 tablet (50 mg total) by mouth every 8 (eight) hours as needed.  . [DISCONTINUED] ciprofloxacin (CIPRO) 500 MG tablet Take 1 tablet (500 mg total) by mouth 2 (two) times daily.  . [DISCONTINUED] HYDROcodone-acetaminophen (NORCO) 10-325 MG per tablet Take 1 tablet by mouth every 8 (eight) hours as needed.  . [DISCONTINUED] metroNIDAZOLE (FLAGYL) 500 MG tablet Take 1 tablet (500 mg total) by mouth 3 (three) times daily.   No facility-administered encounter medications on file as of 03/26/2016.    Activities of Daily Living In your present state of health, do you have any difficulty performing the following activities: 03/26/2016  Hearing? Y  Vision? N  Difficulty concentrating or making decisions? N  Walking or climbing stairs? N  Dressing or bathing? N  Doing errands, shopping? N  Preparing Food and eating ? N  Using the Toilet? N  In the past six months, have you accidently leaked urine? N  Do you have problems with loss of bowel control? N  Managing your Medications? N  Managing your Finances? N  Housekeeping or managing your Housekeeping? N    Patient Care Team: Tonia Ghent, MD as PCP - General Justice Britain, MD as Consulting Physician (Orthopedic Surgery) Erline Levine, MD as Consulting Physician (Neurosurgery)   Assessment:     Hearing Screening   125Hz  250Hz  500Hz  1000Hz  2000Hz  4000Hz  8000Hz   Right ear:   40 40 0 0   Left ear:   40 40 0 0   Vision Screening Comments: Last vision exam was in March 2017   Exercise Activities and Dietary recommendations Current Exercise Habits: Home exercise routine, Type of exercise: Other - see comments;walking (shoulder rehab), Time (Minutes): 30, Frequency (Times/Week): 7, Weekly Exercise (Minutes/Week): 210, Intensity: Moderate, Exercise limited by: None identified  Goals    . Increase physical activity     Starting 03/26/2016, I will continue to walking and doing  shoulder rehab for 30 min daily.       Fall Risk Fall Risk  03/26/2016 06/07/2015  Falls in the past year? No No   Depression Screen PHQ 2/9 Scores 03/26/2016 06/07/2015  PHQ - 2 Score 0 0    Cognitive Testing MMSE - Mini Mental State Exam 03/26/2016  Orientation to time 5  Orientation to Place 5  Registration 3  Attention/ Calculation 0  Recall 3  Language- name 2 objects 0  Language- repeat 1  Language- follow 3 step command 3  Language- read & follow direction 0  Write a sentence 0  Copy design 0  Total score 20   PLEASE NOTE: A Mini-Cog screen was completed. Maximum score is 20. A value of 0 denotes this part of Folstein MMSE was not completed or the patient failed this part of the Mini-Cog screening.   Mini-Cog Screening Orientation to Time - Max 5 pts Orientation to Place - Max 5 pts Registration - Max 3 pts Recall - Max 3 pts Language Repeat - Max 1 pts Language Follow 3 Step Command - Max 3 pts  Immunization History  Administered Date(s) Administered  . Influenza Whole 07/20/2013  . Td 01/02/2003  . Tdap 05/27/2013   Screening Tests Health Maintenance  Topic Date Due  . INFLUENZA VACCINE  05/20/2016  . COLONOSCOPY  12/26/2019  . TETANUS/TDAP  05/28/2023  . Hepatitis C Screening  Completed  . HIV Screening  Completed      Plan:    I have personally reviewed and addressed the Medicare Annual Wellness questionnaire and have noted the following in the patient's chart:  A. Medical and social history B. Use of alcohol, tobacco or illicit drugs  C. Current medications and supplements D. Functional ability and status E.  Nutritional status F.  Physical activity G. Advance directives H. List of other physicians I.  Hospitalizations, surgeries, and ER visits in previous 12 months J.  [place] to include hearing, vision, cognitive, depression L. Referrals and appointments - none  In addition, I have reviewed and discussed with patient certain  preventive protocols, quality metrics, and best practice recommendations. A written personalized care plan for preventive services as well as general preventive health recommendations were provided to patient.  See attached scanned questionnaire for additional information.   Signed,   Lindell Noe, MHA, BS, LPN Health Advisor

## 2016-03-26 NOTE — Progress Notes (Signed)
Poison ivy exposure.  B leg and L arm.  Itchy.  Has been using topical cream with some relief.    [facility]  See [facility].   Likely prev cleared HBV or had a false pos core Ab.   dw pt.  Either way, not clinically significant.    Elevated Cholesterol: Using medications without problems:yes Muscle aches: no Diet compliance:no Exercise:no Due for labs.   Shoulder is clearly improved after surgery.    His back is at baseline and he as able to get by as is.   Using tramadol prn, w/o ADE.  Off other pain meds.    Prostate cancer screening and PSA options (with potential risks and benefits of testing vs not testing) were discussed along with recent recs/guidelines.  He accepted testing PSA at this point.  Meds, vitals, and allergies reviewed.   ROS: Per HPI unless specifically indicated in ROS section   GEN: nad, alert and oriented HEENT: mucous membranes moist NECK: supple w/o LA CV: rrr. PULM: ctab, no inc wob ABD: soft, +bs EXT: no edema SKIN: no acute rash Normal R shoulder ROM.

## 2016-03-26 NOTE — Patient Instructions (Signed)
Use the tramadol as needed.  Take care.  Glad to see you.  Go to the lab on the way out.  We'll contact you with your lab report.

## 2016-03-26 NOTE — Progress Notes (Signed)
PCP notes:  Health maintenance:  HIV screening - completed 09/2015 Hep C screening - completed 09/2015  Abnormal screenings:  Hearing - failed  Patient concerns: None  Nurse concerns: None  Next PCP appt: 03/26/2016 @ 1500  I reviewed health advisor's note, was available for consultation on the day of service listed in this note, and agree with documentation and plan. Elsie Stain, MD.

## 2016-03-26 NOTE — Patient Instructions (Signed)
Andrew [name] , Thank you for taking time to come for your Medicare Wellness Visit. I appreciate your ongoing commitment to your health goals. Please review the following plan we discussed and let me know if I can assist you in the future.   These are the goals we discussed: Goals    . Increase physical activity     Starting 03/26/2016, I will continue to walking and doing shoulder rehab for 30 min daily.        This is a list of the screening recommended for you and due dates:  Health Maintenance  Topic Date Due  . Flu Shot  05/20/2016  . Colon Cancer Screening  12/26/2019  . Tetanus Vaccine  05/28/2023  .  Hepatitis C: One time screening is recommended by Center for Disease Control  (CDC) for  adults born from [birth year] through 1964.   Completed  . HIV Screening  Completed    Preventive Care for Adults  A healthy lifestyle and preventive care can promote health and wellness. Preventive health guidelines for adults include the following key practices.  . A routine yearly physical is a good way to check with your health care provider about your health and preventive screening. It is a chance to share any concerns and updates on your health and to receive a thorough exam.  . Visit your dentist for a routine exam and preventive care every 6 months. Brush your teeth twice a day and floss once a day. Good oral hygiene prevents tooth decay and gum disease.  . The frequency of eye exams is based on your age, health, family medical history, use  of contact lenses, and other factors. Follow your health care provider's ecommendations for frequency of eye exams.  . Eat a healthy diet. Foods like vegetables, fruits, whole grains, low-fat dairy products, and lean protein foods contain the nutrients you need without too many calories. Decrease your intake of foods high in solid fats, added sugars, and salt. Eat the right amount of calories for you. Get information about a proper diet from your health care  provider, if necessary.  . Regular physical exercise is one of the most important things you can do for your health. Most adults should get at least 150 minutes of moderate-intensity exercise (any activity that increases your heart rate and causes you to sweat) each week. In addition, most adults need muscle-strengthening exercises on 2 or more days a week.  Silver Sneakers may be a benefit available to you. To determine eligibility, you may visit the website: www.silversneakers.com or contact program at 320-540-6465 Mon-Fri between 8AM-8PM.   . Maintain a healthy weight. The body mass index (BMI) is a screening tool to identify possible weight problems. It provides an estimate of body fat based on height and weight. Your health care provider can find your BMI and can help you achieve or maintain a healthy weight.   For adults 20 years and older: ? A BMI below 18.5 is considered underweight. ? A BMI of 18.5 to 24.9 is normal. ? A BMI of 25 to 29.9 is considered overweight. ? A BMI of 30 and above is considered obese.   . Maintain normal blood lipids and cholesterol levels by exercising and minimizing your intake of saturated fat. Eat a balanced diet with plenty of fruit and vegetables. Blood tests for lipids and cholesterol should begin at age 39 and be repeated every 5 years. If your lipid or cholesterol levels are high, you are over 50,  or you are at high risk for heart disease, you may need your cholesterol levels checked more frequently. Ongoing high lipid and cholesterol levels should be treated with medicines if diet and exercise are not working.  . If you smoke, find out from your health care provider how to quit. If you do not use tobacco, please do not start.  . If you choose to drink alcohol, please do not consume more than 2 drinks per day. One drink is considered to be 12 ounces (355 mL) of beer, 5 ounces (148 mL) of wine, or 1.5 ounces (44 mL) of liquor.  . If you are 29-79 years  old, ask your health care provider if you should take aspirin to prevent strokes.  . Use sunscreen. Apply sunscreen liberally and repeatedly throughout the day. You should seek shade when your shadow is shorter than you. Protect yourself by wearing long sleeves, pants, a wide-brimmed hat, and sunglasses year round, whenever you are outdoors.  . Once a month, do a whole body skin exam, using a mirror to look at the skin on your back. Tell your health care provider of new moles, moles that have irregular borders, moles that are larger than a pencil eraser, or moles that have changed in shape or color.

## 2016-03-27 DIAGNOSIS — Z125 Encounter for screening for malignant neoplasm of prostate: Secondary | ICD-10-CM | POA: Insufficient documentation

## 2016-03-27 LAB — COMPREHENSIVE METABOLIC PANEL
ALBUMIN: 4.8 g/dL (ref 3.5–5.2)
ALK PHOS: 59 U/L (ref 39–117)
ALT: 32 U/L (ref 0–53)
AST: 22 U/L (ref 0–37)
BILIRUBIN TOTAL: 0.5 mg/dL (ref 0.2–1.2)
BUN: 14 mg/dL (ref 6–23)
CALCIUM: 9.7 mg/dL (ref 8.4–10.5)
CO2: 31 mEq/L (ref 19–32)
CREATININE: 0.83 mg/dL (ref 0.40–1.50)
Chloride: 105 mEq/L (ref 96–112)
GFR: 100.87 mL/min (ref >60.00)
Glucose, Bld: 61 mg/dL — ABNORMAL LOW (ref 70–99)
Potassium: 3.8 mEq/L (ref 3.5–5.1)
SODIUM: 142 meq/L (ref 135–145)
TOTAL PROTEIN: 7 g/dL (ref 6.0–8.3)

## 2016-03-27 LAB — PSA, MEDICARE: PSA: 0.27 ng/mL (ref 0.10–4.00)

## 2016-03-27 LAB — LIPID PANEL
CHOLESTEROL: 141 mg/dL (ref 0–200)
HDL: 46.3 mg/dL (ref >39.00)
LDL CALC: 82 mg/dL (ref 0–99)
NonHDL: 94.58
Total CHOL/HDL Ratio: 3
Triglycerides: 65 mg/dL (ref 0.0–149.0)
VLDL: 13 mg/dL (ref 0.0–40.0)

## 2016-03-27 NOTE — Assessment & Plan Note (Signed)
Continue prn tramadol.  Doing well.  No ADE on med.

## 2016-03-27 NOTE — Assessment & Plan Note (Signed)
See note on labs  

## 2016-03-27 NOTE — Assessment & Plan Note (Signed)
See notes on labs.  Continue statin.   

## 2016-03-28 ENCOUNTER — Encounter: Payer: Self-pay | Admitting: Family Medicine

## 2016-03-28 ENCOUNTER — Encounter: Payer: Self-pay | Admitting: *Deleted

## 2016-11-12 ENCOUNTER — Other Ambulatory Visit: Payer: Self-pay | Admitting: Family Medicine

## 2016-12-02 ENCOUNTER — Encounter: Payer: Self-pay | Admitting: Family Medicine

## 2017-05-26 ENCOUNTER — Encounter: Payer: Self-pay | Admitting: Family Medicine

## 2017-05-27 ENCOUNTER — Encounter: Payer: Self-pay | Admitting: Family Medicine

## 2017-05-27 ENCOUNTER — Other Ambulatory Visit: Payer: Self-pay | Admitting: Family Medicine

## 2017-05-27 DIAGNOSIS — E7800 Pure hypercholesterolemia, unspecified: Secondary | ICD-10-CM

## 2017-05-29 ENCOUNTER — Encounter: Payer: Self-pay | Admitting: Family Medicine

## 2017-05-29 ENCOUNTER — Other Ambulatory Visit: Payer: Self-pay | Admitting: Family Medicine

## 2017-05-29 DIAGNOSIS — Z125 Encounter for screening for malignant neoplasm of prostate: Secondary | ICD-10-CM

## 2017-06-03 ENCOUNTER — Encounter: Payer: Self-pay | Admitting: Family Medicine

## 2017-06-03 ENCOUNTER — Ambulatory Visit (INDEPENDENT_AMBULATORY_CARE_PROVIDER_SITE_OTHER): Payer: Medicare Other | Admitting: Family Medicine

## 2017-06-03 DIAGNOSIS — G8929 Other chronic pain: Secondary | ICD-10-CM | POA: Diagnosis not present

## 2017-06-03 DIAGNOSIS — M545 Low back pain: Secondary | ICD-10-CM

## 2017-06-03 DIAGNOSIS — E7800 Pure hypercholesterolemia, unspecified: Secondary | ICD-10-CM | POA: Diagnosis not present

## 2017-06-03 DIAGNOSIS — Z125 Encounter for screening for malignant neoplasm of prostate: Secondary | ICD-10-CM

## 2017-06-03 LAB — LIPID PANEL
CHOL/HDL RATIO: 4
Cholesterol: 161 mg/dL (ref 0–200)
HDL: 45.1 mg/dL (ref >39.00)
LDL Cholesterol: 95 mg/dL (ref 0–99)
NonHDL: 116.19
TRIGLYCERIDES: 106 mg/dL (ref 0.0–149.0)
VLDL: 21.2 mg/dL (ref 0.0–40.0)

## 2017-06-03 LAB — COMPREHENSIVE METABOLIC PANEL
ALT: 32 U/L (ref 0–53)
AST: 19 U/L (ref 0–37)
Albumin: 4.7 g/dL (ref 3.5–5.2)
Alkaline Phosphatase: 56 U/L (ref 39–117)
BILIRUBIN TOTAL: 0.7 mg/dL (ref 0.2–1.2)
BUN: 14 mg/dL (ref 6–23)
CHLORIDE: 103 meq/L (ref 96–112)
CO2: 30 meq/L (ref 19–32)
Calcium: 9.9 mg/dL (ref 8.4–10.5)
Creatinine, Ser: 0.97 mg/dL (ref 0.40–1.50)
GFR: 83.92 mL/min (ref >60.00)
Glucose, Bld: 95 mg/dL (ref 70–99)
Potassium: 4.3 mEq/L (ref 3.5–5.1)
Sodium: 140 mEq/L (ref 135–145)
Total Protein: 7 g/dL (ref 6.0–8.3)

## 2017-06-03 LAB — PSA, MEDICARE: PSA: 0.23 ng/ml (ref 0.10–4.00)

## 2017-06-03 MED ORDER — TIZANIDINE HCL 4 MG PO TABS: 2.0000 mg | ORAL_TABLET | Freq: Four times a day (QID) | ORAL | 2 refills | Status: DC | PRN

## 2017-06-03 MED ORDER — TRAMADOL HCL 50 MG PO TABS: 50.0000 mg | ORAL_TABLET | Freq: Three times a day (TID) | ORAL | 3 refills | Status: DC | PRN

## 2017-06-03 NOTE — Patient Instructions (Signed)
We'll contact you about your labs.  Stopthe lipitor for 2 weeks and see if that helps with the aches.  Either way, update me in about 2 weeks.  Try tizanidine for the muscle spasms.  Take care.  Glad to see you.

## 2017-06-03 NOTE — Progress Notes (Signed)
He has been more active and playing with his grandkids but he is having more pain.  His L foot is still numb at baseline but he has more leg aches in the meantime.  Unclear if the lipitor is making his aches worse.  He can't tell much difference with skipping a day or statin. He had stomach upset with celebrex.  He stopped it in the meantime.  He is taking tramadol as needed.    Elevated Cholesterol: Using medications without problems: see above.  Muscle aches:  See above.   Diet compliance:yes Exercise:as tolerated.   He doesn't have FH of prostate CA.  D/w pt.  Clarified.   See notes on labs.   Advance directive= wife [name] designated if patient were incapacitated.   PMH and SH reviewed  Meds, vitals, and allergies reviewed.   ROS: Per HPI unless specifically indicated in ROS section   GEN: nad, alert and oriented NECK: supple w/o LA CV: rrr. PULM: ctab, no inc wob ABD: soft, +bs EXT: no edema Paresthesia L leg at baseline.

## 2017-06-04 NOTE — Assessment & Plan Note (Signed)
He has chronic back and leg pain. He is tolerating the pain is best he can. Tramadol helps some. No adverse effect on medication. Celebrex became less useful overtime. He stopped in the meantime. We talked about muscle relaxer use. Likely reasonable to try tizanidine as needed, sedation caution given. He will update me as needed. No emergent symptoms, but has had worsening of his previous symptoms as he has been more active recently.

## 2017-06-04 NOTE — Assessment & Plan Note (Signed)
See notes on labs. 

## 2017-06-04 NOTE — Assessment & Plan Note (Signed)
See notes on labs. Stopped Lipitor for now. Unclear if this is contributing to his back pain. He will let me know about his situation in about 2 weeks. If his aches are clearly better off of the medication then we will need to adjust his regimen. If his aches are not any different then it is likely reasonable to restart Lipitor as he has been taking.

## 2017-06-17 ENCOUNTER — Encounter: Payer: Self-pay | Admitting: Family Medicine

## 2017-06-29 ENCOUNTER — Encounter: Payer: Self-pay | Admitting: Family Medicine

## 2017-07-06 ENCOUNTER — Telehealth: Payer: Self-pay | Admitting: Family Medicine

## 2017-07-06 NOTE — Telephone Encounter (Signed)
Left pt message asking to call Andrew [name] back directly at [phone] to schedule AWV + labs with Katha Cabal and CPE with PCP.  *NOTE* Last AWV 03/26/16

## 2017-07-07 NOTE — Telephone Encounter (Signed)
Spoke to pt. He was under the impression he had his CPE on 06/03/17 with Damita Dunnings. He lives over an hour away and declined AWV with [facility] for 2018. Pt will call back at beginning of 2019 and schedule AWV + labs with Katha Cabal and CPE with PCP (same day).

## 2017-09-03 ENCOUNTER — Encounter: Payer: Self-pay | Admitting: Family Medicine

## 2017-09-08 ENCOUNTER — Other Ambulatory Visit: Payer: Self-pay | Admitting: Family Medicine

## 2017-09-08 DIAGNOSIS — E785 Hyperlipidemia, unspecified: Secondary | ICD-10-CM

## 2017-09-16 ENCOUNTER — Other Ambulatory Visit (INDEPENDENT_AMBULATORY_CARE_PROVIDER_SITE_OTHER): Payer: Medicare Other

## 2017-09-16 DIAGNOSIS — E785 Hyperlipidemia, unspecified: Secondary | ICD-10-CM

## 2017-09-16 LAB — LIPID PANEL
CHOLESTEROL: 237 mg/dL — AB (ref 0–200)
HDL: 42.9 mg/dL (ref >39.00)
LDL CALC: 176 mg/dL — AB (ref 0–99)
NonHDL: 194.35
TRIGLYCERIDES: 92 mg/dL (ref 0.0–149.0)
Total CHOL/HDL Ratio: 6
VLDL: 18.4 mg/dL (ref 0.0–40.0)

## 2017-09-22 ENCOUNTER — Other Ambulatory Visit: Payer: Self-pay | Admitting: Family Medicine

## 2017-09-22 DIAGNOSIS — E785 Hyperlipidemia, unspecified: Secondary | ICD-10-CM

## 2017-09-22 MED ORDER — PRAVASTATIN SODIUM 20 MG PO TABS: 20.0000 mg | ORAL_TABLET | Freq: Every day | ORAL | 3 refills | Status: DC

## 2017-12-09 ENCOUNTER — Other Ambulatory Visit: Payer: Medicare Other

## 2018-01-13 ENCOUNTER — Other Ambulatory Visit (INDEPENDENT_AMBULATORY_CARE_PROVIDER_SITE_OTHER): Payer: Medicare Other

## 2018-01-13 DIAGNOSIS — E785 Hyperlipidemia, unspecified: Secondary | ICD-10-CM

## 2018-01-13 LAB — LIPID PANEL
CHOL/HDL RATIO: 4
Cholesterol: 174 mg/dL (ref 0–200)
HDL: 43.8 mg/dL (ref >39.00)
LDL Cholesterol: 115 mg/dL — ABNORMAL HIGH (ref 0–99)
NonHDL: 129.7
Triglycerides: 75 mg/dL (ref 0.0–149.0)
VLDL: 15 mg/dL (ref 0.0–40.0)

## 2018-01-18 ENCOUNTER — Encounter: Payer: Self-pay | Admitting: Family Medicine

## 2018-04-07 ENCOUNTER — Encounter: Payer: Self-pay | Admitting: Family Medicine

## 2018-04-07 ENCOUNTER — Ambulatory Visit (INDEPENDENT_AMBULATORY_CARE_PROVIDER_SITE_OTHER): Payer: Medicare Other

## 2018-04-07 ENCOUNTER — Ambulatory Visit (INDEPENDENT_AMBULATORY_CARE_PROVIDER_SITE_OTHER): Payer: Medicare Other | Admitting: Family Medicine

## 2018-04-07 ENCOUNTER — Other Ambulatory Visit: Payer: Self-pay | Admitting: Family Medicine

## 2018-04-07 VITALS — BP 102/78 | HR 75 | Temp 98.0°F | Ht 76.0 in | Wt 218.2 lb

## 2018-04-07 DIAGNOSIS — Z0000 Encounter for general adult medical examination without abnormal findings: Secondary | ICD-10-CM | POA: Diagnosis not present

## 2018-04-07 DIAGNOSIS — N529 Male erectile dysfunction, unspecified: Secondary | ICD-10-CM

## 2018-04-07 DIAGNOSIS — E785 Hyperlipidemia, unspecified: Secondary | ICD-10-CM

## 2018-04-07 DIAGNOSIS — E7800 Pure hypercholesterolemia, unspecified: Secondary | ICD-10-CM

## 2018-04-07 DIAGNOSIS — G8929 Other chronic pain: Secondary | ICD-10-CM | POA: Diagnosis not present

## 2018-04-07 DIAGNOSIS — M545 Low back pain: Secondary | ICD-10-CM | POA: Diagnosis not present

## 2018-04-07 LAB — LIPID PANEL
CHOL/HDL RATIO: 4
Cholesterol: 172 mg/dL (ref 0–200)
HDL: 47.8 mg/dL (ref >39.00)
LDL Cholesterol: 111 mg/dL — ABNORMAL HIGH (ref 0–99)
NONHDL: 124.64
Triglycerides: 68 mg/dL (ref 0.0–149.0)
VLDL: 13.6 mg/dL (ref 0.0–40.0)

## 2018-04-07 LAB — COMPREHENSIVE METABOLIC PANEL
ALT: 31 U/L (ref 0–53)
AST: 20 U/L (ref 0–37)
Albumin: 4.7 g/dL (ref 3.5–5.2)
Alkaline Phosphatase: 57 U/L (ref 39–117)
BILIRUBIN TOTAL: 0.6 mg/dL (ref 0.2–1.2)
BUN: 17 mg/dL (ref 6–23)
CO2: 30 meq/L (ref 19–32)
Calcium: 9.5 mg/dL (ref 8.4–10.5)
Chloride: 103 mEq/L (ref 96–112)
Creatinine, Ser: 0.87 mg/dL (ref 0.40–1.50)
GFR: 94.88 mL/min (ref >60.00)
GLUCOSE: 93 mg/dL (ref 70–99)
Potassium: 4.2 mEq/L (ref 3.5–5.1)
SODIUM: 139 meq/L (ref 135–145)
Total Protein: 7.3 g/dL (ref 6.0–8.3)

## 2018-04-07 MED ORDER — SILDENAFIL CITRATE 20 MG PO TABS: 60.0000 mg | ORAL_TABLET | Freq: Every day | ORAL | 12 refills | Status: DC | PRN

## 2018-04-07 MED ORDER — CELECOXIB 100 MG PO CAPS: 100.0000 mg | ORAL_CAPSULE | Freq: Two times a day (BID) | ORAL | 3 refills | Status: DC | PRN

## 2018-04-07 MED ORDER — TIZANIDINE HCL 4 MG PO TABS: 2.0000 mg | ORAL_TABLET | Freq: Four times a day (QID) | ORAL | 3 refills | Status: DC | PRN

## 2018-04-07 NOTE — Progress Notes (Signed)
PCP notes:   Health maintenance:  No gaps identified.  Abnormal screenings:   None  Patient concerns:   Patient wants to discuss Celebrex and other medications with PCP at next appt.   Nurse concerns:  None  Next PCP appt:   04/07/18 @ 1500  I reviewed health advisor's note, was available for consultation on the day of service listed in this note, and agree with documentation and plan. Elsie Stain, MD.

## 2018-04-07 NOTE — Progress Notes (Signed)
Elevated Cholesterol: He is taking pravastatin every other day.  He has some aches but more tolerable with every other day dosing.   Repeat labs pending.  Using medications without problems: Muscle aches:  See above.   Diet compliance: yes Exercise: as tolerated.    Back pain.  He has back pain and some L leg numbness at baseline.  He has tapered down on tramadol, now off med.  He is taking tylenol.  We talked about trial of celebrex again, he may be able to tolerate a lower dose.  He didn't have absolute contraindication to celebrex trial.  He needs a refill on tizanidine.  No ADE on med.  He is putting up with the pain o/w, as is.  Usually 3-4/10 but higher if overworking on his "honey-do" list.  Erectile dysfunction discussed with patient.  Has been able to use sildenafil in the past without adverse effect.  Needed refill.  Done at office visit.  Hearing loss.  Has hearing aids.  Compliant.   PSA wnl <1 year ago, d/w pt.    PMH and SH reviewed  Meds, vitals, and allergies reviewed.   ROS: Per HPI unless specifically indicated in ROS section   GEN: nad, alert and oriented HEENT: mucous membranes moist NECK: supple w/o LA CV: rrr. PULM: ctab, no inc wob ABD: soft, +bs EXT: no edema SKIN: no acute rash

## 2018-04-07 NOTE — Patient Instructions (Signed)
Andrew [name] , Thank you for taking time to come for your Medicare Wellness Visit. I appreciate your ongoing commitment to your health goals. Please review the following plan we discussed and let me know if I can assist you in the future.   These are the goals we discussed: Goals    . Patient Stated     Starting 04/07/2018, I will continue to take medications as prescribed and to continue walking for 15 minutes daily.        This is a list of the screening recommended for you and due dates:  Health Maintenance  Topic Date Due  . Flu Shot  05/20/2018  . Colon Cancer Screening  12/26/2019  . Tetanus Vaccine  05/28/2023  .  Hepatitis C: One time screening is recommended by Center for Disease Control  (CDC) for  adults born from [birth year] through 1964.   Completed  . HIV Screening  Completed   Preventive Care for Adults  A healthy lifestyle and preventive care can promote health and wellness. Preventive health guidelines for adults include the following key practices.  . A routine yearly physical is a good way to check with your health care provider about your health and preventive screening. It is a chance to share any concerns and updates on your health and to receive a thorough exam.  . Visit your dentist for a routine exam and preventive care every 6 months. Brush your teeth twice a day and floss once a day. Good oral hygiene prevents tooth decay and gum disease.  . The frequency of eye exams is based on your age, health, family medical history, use  of contact lenses, and other factors. Follow your health care provider's recommendations for frequency of eye exams.  . Eat a healthy diet. Foods like vegetables, fruits, whole grains, low-fat dairy products, and lean protein foods contain the nutrients you need without too many calories. Decrease your intake of foods high in solid fats, added sugars, and salt. Eat the right amount of calories for you. Get information about a proper diet from your  health care provider, if necessary.  . Regular physical exercise is one of the most important things you can do for your health. Most adults should get at least 150 minutes of moderate-intensity exercise (any activity that increases your heart rate and causes you to sweat) each week. In addition, most adults need muscle-strengthening exercises on 2 or more days a week.  Silver Sneakers may be a benefit available to you. To determine eligibility, you may visit the website: www.silversneakers.com or contact program at (225)817-4903 Mon-Fri between 8AM-8PM.   . Maintain a healthy weight. The body mass index (BMI) is a screening tool to identify possible weight problems. It provides an estimate of body fat based on height and weight. Your health care provider can find your BMI and can help you achieve or maintain a healthy weight.   For adults 20 years and older: ? A BMI below 18.5 is considered underweight. ? A BMI of 18.5 to 24.9 is normal. ? A BMI of 25 to 29.9 is considered overweight. ? A BMI of 30 and above is considered obese.   . Maintain normal blood lipids and cholesterol levels by exercising and minimizing your intake of saturated fat. Eat a balanced diet with plenty of fruit and vegetables. Blood tests for lipids and cholesterol should begin at age 55 and be repeated every 5 years. If your lipid or cholesterol levels are high, you are over  50, or you are at high risk for heart disease, you may need your cholesterol levels checked more frequently. Ongoing high lipid and cholesterol levels should be treated with medicines if diet and exercise are not working.  . If you smoke, find out from your health care provider how to quit. If you do not use tobacco, please do not start.  . If you choose to drink alcohol, please do not consume more than 2 drinks per day. One drink is considered to be 12 ounces (355 mL) of beer, 5 ounces (148 mL) of wine, or 1.5 ounces (44 mL) of liquor.  . If you are  55-36 years old, ask your health care provider if you should take aspirin to prevent strokes.  . Use sunscreen. Apply sunscreen liberally and repeatedly throughout the day. You should seek shade when your shadow is shorter than you. Protect yourself by wearing long sleeves, pants, a wide-brimmed hat, and sunglasses year round, whenever you are outdoors.  . Once a month, do a whole body skin exam, using a mirror to look at the skin on your back. Tell your health care provider of new moles, moles that have irregular borders, moles that are larger than a pencil eraser, or moles that have changed in shape or color.

## 2018-04-07 NOTE — Progress Notes (Signed)
Subjective:   Andrew [name] is a 62 y.o. male who presents for Medicare Annual/Subsequent preventive examination.  Review of Systems:  N/A Cardiac Risk Factors include: advanced age (>15men, >5 women);male gender;dyslipidemia     Objective:    Vitals: BP 102/78 (BP Location: Right Arm, Patient Position: Sitting, Cuff Size: Normal)   Pulse 75   Temp 98 F (36.7 C) (Oral)   Ht 6\' 4"  (1.93 m) Comment: no shoes  Wt 218 lb 4 oz (99 kg)   SpO2 98%   BMI 26.57 kg/m   Body mass index is 26.57 kg/m.  Advanced Directives 04/07/2018 03/26/2016 03/26/2016 12/26/2014 12/12/2014  Does Patient Have a Medical Advance Directive? Yes Yes Yes Yes Yes  Type of Paramedic of [place];Living will [place];Living will [place];Living will - [place];Living will  Does patient want to make changes to medical advance directive? - Yes - information given Yes - information given - -  Copy of [place] in Chart? No - copy requested No - copy requested No - copy requested No - copy requested -    Tobacco Social History   Tobacco Use  Smoking Status Former Smoker  Smokeless Tobacco Never Used     Counseling given: No   Clinical Intake:  Pre-visit preparation completed: Yes  Pain : No/denies pain Pain Score: 4  Pain Type: Chronic pain Pain Location: Back Pain Orientation: Lower Pain Onset: More than a month ago Pain Frequency: Constant     Nutritional Status: BMI 25 -29 Overweight Nutritional Risks: None Diabetes: No  How often do you need to have someone help you when you read instructions, pamphlets, or other written materials from your doctor or pharmacy?: 1 - Never What is the last grade level you completed in school?: Bachelors degree  Interpreter Needed?: No  Comments: pt lives with spouse Information entered by :: LPinson, LPN  Past Medical History:  Diagnosis Date  . Arthritis    . Back pain    With spinal cord stimulator.  S/p lumbar fusion, diskectomy.  Prev seen by Dr. Vertell Limber   . Diverticulitis   . Diverticulosis   . Erectile dysfunction   . Hyperlipidemia   . Leg paresthesia    L foot numb- since 1999 surgery   Past Surgical History:  Procedure Laterality Date  . APPENDECTOMY    . BACK SURGERY     Lumbar back surgery L3/4 ans L5/S1 rods and screws L3-S1 (Dr Carloyn Manner) 02/29/2008  . LAMINECTOMY      L4/5 1975,  L5/S1 1990  . SHOULDER SURGERY Right   . SPINAL CORD STIMULATOR IMPLANT     Family History  Problem Relation Age of Onset  . Alzheimer's disease Mother   . Dementia Mother   . Heart disease Father   . Hyperlipidemia Father   . Dementia Father   . Colon cancer Brother   . Prostate cancer Neg Hx   . Esophageal cancer Neg Hx   . Rectal cancer Neg Hx   . Stomach cancer Neg Hx    Social History   Socioeconomic History  . Marital status: Married    Spouse name: Not on file  . Number of children: 1  . Years of education: Not on file  . Highest education level: Not on file  Occupational History  . Occupation: retired, because of back  Social Needs  . Financial resource strain: Not on file  . Food insecurity:  Worry: Not on file    Inability: Not on file  . Transportation needs:    Medical: Not on file    Non-medical: Not on file  Tobacco Use  . Smoking status: Former Research scientist (life sciences)  . Smokeless tobacco: Never Used  Substance and Sexual Activity  . Alcohol use: Not Currently    Alcohol/week: 0.0 oz  . Drug use: No  . Sexual activity: Yes  Lifestyle  . Physical activity:    Days per week: Not on file    Minutes per session: Not on file  . Stress: Not on file  Relationships  . Social connections:    Talks on phone: Not on file    Gets together: Not on file    Attends religious service: Not on file    Active member of club or organization: Not on file    Attends meetings of clubs or organizations: Not on file    Relationship status:  Not on file  Other Topics Concern  . Not on file  Social History Narrative   Disabled from back pain   Prev EMS/ambulance driver   Divorced, remarried 2015    Outpatient Encounter Medications as of 04/07/2018  Medication Sig  . Acetaminophen (TYLENOL EXTRA STRENGTH PO) Take by mouth as needed.  . docusate sodium (COLACE) 100 MG capsule Take 100 mg by mouth 2 (two) times daily.    . Omega-3 Fatty Acids (FISH OIL) 1200 MG CAPS Take 1,200 mg by mouth daily.  . pravastatin (PRAVACHOL) 20 MG tablet Take 1 tablet (20 mg total) by mouth daily.  . sildenafil (REVATIO) 20 MG tablet Take 2-3 tablets (40-60 mg total) by mouth daily as needed.  Marland Kitchen tiZANidine (ZANAFLEX) 4 MG tablet Take 0.5-1 tablets (2-4 mg total) by mouth every 6 (six) hours as needed for muscle spasms.  . traMADol (ULTRAM) 50 MG tablet Take 1 tablet (50 mg total) by mouth every 8 (eight) hours as needed.   No facility-administered encounter medications on file as of 04/07/2018.     Activities of Daily Living In your present state of health, do you have any difficulty performing the following activities: 04/07/2018  Hearing? Y  Vision? N  Difficulty concentrating or making decisions? N  Walking or climbing stairs? N  Dressing or bathing? N  Doing errands, shopping? N  Preparing Food and eating ? N  Using the Toilet? N  In the past six months, have you accidently leaked urine? N  Do you have problems with loss of bowel control? N  Managing your Medications? N  Managing your Finances? N  Housekeeping or managing your Housekeeping? N  Some recent data might be hidden    Patient Care Team: Tonia Ghent, MD as PCP - General Justice Britain, MD as Consulting Physician (Orthopedic Surgery) Erline Levine, MD as Consulting Physician (Neurosurgery)   Assessment:   This is a routine wellness examination for Andrew [name].   Visual Acuity Screening   Right eye Left eye Both eyes  Without correction: 20/13-1 20/15-1 20/13  With  correction:     Hearing Screening Comments: Bilateral hearing aids  Exercise Activities and Dietary recommendations Current Exercise Habits: Home exercise routine, Type of exercise: walking, Time (Minutes): 15, Frequency (Times/Week): 7, Weekly Exercise (Minutes/Week): 105, Intensity: Mild, Exercise limited by: orthopedic condition(s)  Goals    . Patient Stated     Starting 04/07/2018, I will continue to take medications as prescribed and to continue walking for 15 minutes daily.  Fall Risk Fall Risk  04/07/2018 06/03/2017 03/26/2016 03/26/2016 06/07/2015  Falls in the past year? No No No No No   Depression Screen PHQ 2/9 Scores 04/07/2018 06/03/2017 03/26/2016 03/26/2016  PHQ - 2 Score 0 0 0 0  PHQ- 9 Score 0 - - -    Cognitive Function MMSE - Mini Mental State Exam 04/07/2018 03/26/2016  Orientation to time 5 5  Orientation to Place 5 5  Registration 3 3  Attention/ Calculation 0 0  Recall 3 3  Language- name 2 objects 0 0  Language- repeat 1 1  Language- follow 3 step command 3 3  Language- read & follow direction 0 0  Write a sentence 0 0  Copy design 0 0  Total score 20 20     PLEASE NOTE: A Mini-Cog screen was completed. Maximum score is 20. A value of 0 denotes this part of Folstein MMSE was not completed or the patient failed this part of the Mini-Cog screening.   Mini-Cog Screening Orientation to Time - Max 5 pts Orientation to Place - Max 5 pts Registration - Max 3 pts Recall - Max 3 pts Language Repeat - Max 1 pts Language Follow 3 Step Command - Max 3 pts     Immunization History  Administered Date(s) Administered  . Influenza Whole 07/20/2013  . Influenza-Unspecified 07/13/2017  . Td 01/02/2003  . Tdap 05/27/2013    Screening Tests Health Maintenance  Topic Date Due  . INFLUENZA VACCINE  05/20/2018  . COLONOSCOPY  12/26/2019  . TETANUS/TDAP  05/28/2023  . Hepatitis C Screening  Completed  . HIV Screening  Completed      Plan:     I have  personally reviewed, addressed, and noted the following in the patient's chart:  A. Medical and social history B. Use of alcohol, tobacco or illicit drugs  C. Current medications and supplements D. Functional ability and status E.  Nutritional status F.  Physical activity G. Advance directives H. List of other physicians I.  Hospitalizations, surgeries, and ER visits in previous 12 months J.  [place] to include hearing, vision, cognitive, depression L. Referrals and appointments - none  In addition, I have reviewed and discussed with patient certain preventive protocols, quality metrics, and best practice recommendations. A written personalized care plan for preventive services as well as general preventive health recommendations were provided to patient.  See attached scanned questionnaire for additional information.   Signed,   Lindell Noe, MHA, BS, LPN Health Coach   Lindell Noe, [place]  11/16/7865

## 2018-04-07 NOTE — Patient Instructions (Addendum)
We'll contact you with your lab report. It may be reasonable to change to crestor but let me see your labs first.   Try the celebrex in the meantime.   Check with your insurance to see if they will cover the shingrix shot.  We are back ordered.   Take care.  Glad to see you.  Update me as needed.

## 2018-04-08 DIAGNOSIS — Z0000 Encounter for general adult medical examination without abnormal findings: Secondary | ICD-10-CM | POA: Insufficient documentation

## 2018-04-08 NOTE — Assessment & Plan Note (Signed)
Continue as needed sildenafil.  Routine cautions given.

## 2018-04-08 NOTE — Assessment & Plan Note (Signed)
He has had multiple interventions on his back.  He is putting up with the pain is best he can.  His pain is about a 3 or 4 out of a 10.  When he is more active he has more pain.  We talked about options.  He has been able to wean down off tramadol.  He is using Tylenol.  It may be reasonable to try a low dose of Celebrex 100 mg once a day and then increase to twice a day if tolerated and helpful.  Routine cautions given.  Continue tizanidine as needed for muscle spasms.  Continue stretching and activity as tolerated.  He agrees.

## 2018-04-08 NOTE — Assessment & Plan Note (Signed)
Hearing loss.  Has hearing aids.  Compliant.   PSA wnl <1 year ago, d/w pt.   Colon cancer screening up-to-date.  Routine vaccination discussed with patient.

## 2018-04-08 NOTE — Assessment & Plan Note (Addendum)
Check routine labs today.  It may be that he needs to switch over to Crestor 1-2 times per week.  I can potentially send this to mail order after I see his labs.  He did tolerate the lower dose of pravastatin better than the higher dose.  D/w pt about options and his situation.  See notes on labs.   >25 minutes spent in face to face time with patient, >50% spent in counselling or coordination of care.

## 2018-04-12 ENCOUNTER — Other Ambulatory Visit: Payer: Self-pay | Admitting: Family Medicine

## 2018-04-12 MED ORDER — PRAVASTATIN SODIUM 20 MG PO TABS: 20.0000 mg | ORAL_TABLET | ORAL | Status: DC

## 2018-06-15 ENCOUNTER — Encounter: Payer: Self-pay | Admitting: Family Medicine

## 2018-08-10 ENCOUNTER — Other Ambulatory Visit: Payer: Self-pay | Admitting: Family Medicine

## 2018-10-04 ENCOUNTER — Telehealth: Payer: Self-pay | Admitting: Family Medicine

## 2018-10-04 NOTE — Telephone Encounter (Signed)
Best number [phone] I called pt to r/s 04/12/18 with dr Damita Dunnings for cpx.  He has a medicare wellness the same day. He wanted me to ask you to call him to r/s these appointments.  He stated you told him if he ever had a question about scheduling appointments  To have you call him.  I tried to r/s but he wanted to talk to you

## 2018-11-28 ENCOUNTER — Encounter: Payer: Self-pay | Admitting: Family Medicine

## 2018-11-29 ENCOUNTER — Other Ambulatory Visit: Payer: Self-pay | Admitting: Family Medicine

## 2018-11-29 MED ORDER — OSELTAMIVIR PHOSPHATE 75 MG PO CAPS: 75.0000 mg | ORAL_CAPSULE | Freq: Two times a day (BID) | ORAL | 0 refills | Status: DC

## 2019-03-29 ENCOUNTER — Encounter: Payer: Self-pay | Admitting: Family Medicine

## 2019-03-29 NOTE — Telephone Encounter (Signed)
Pt called to be sure this was seen.

## 2019-03-31 ENCOUNTER — Other Ambulatory Visit: Payer: Self-pay | Admitting: Family Medicine

## 2019-03-31 MED ORDER — PREDNISONE 20 MG PO TABS: ORAL_TABLET | ORAL | 0 refills | Status: DC

## 2019-04-13 ENCOUNTER — Ambulatory Visit: Payer: Medicare Other

## 2019-04-13 ENCOUNTER — Ambulatory Visit (INDEPENDENT_AMBULATORY_CARE_PROVIDER_SITE_OTHER): Payer: Medicare Other

## 2019-04-13 ENCOUNTER — Encounter: Payer: Medicare Other | Admitting: Family Medicine

## 2019-04-13 DIAGNOSIS — Z0000 Encounter for general adult medical examination without abnormal findings: Secondary | ICD-10-CM

## 2019-04-13 NOTE — Progress Notes (Signed)
PCP notes:   Health maintenance:  None  Abnormal screenings:   None  Patient concerns:   Chronic back pain - plans to consult with neurosurgeon  Nurse concerns:  None  Next PCP appt:   04/18/19 @ 0845

## 2019-04-13 NOTE — Progress Notes (Signed)
Subjective:   Andrew [name] is a 63 y.o. male who presents for Medicare Annual/Subsequent preventive examination.  Review of Systems:  N/A Cardiac Risk Factors include: advanced age (>72men, >23 women);male gender;dyslipidemia     Objective:    Vitals: There were no vitals taken for this visit.  There is no height or weight on file to calculate BMI.  Advanced Directives 04/13/2019 04/07/2018 03/26/2016 03/26/2016 12/26/2014 12/12/2014  Does Patient Have a Medical Advance Directive? Yes Yes Yes Yes Yes Yes  Type of Paramedic of [place];Living will [place];Living will [place];Living will [place];Living will - [place];Living will  Does patient want to make changes to medical advance directive? - - Yes - information given Yes - information given - -  Copy of [place] in Chart? No - copy requested No - copy requested No - copy requested No - copy requested No - copy requested -    Tobacco Social History   Tobacco Use  Smoking Status Former Smoker  Smokeless Tobacco Never Used     Counseling given: No   Clinical Intake:  Pre-visit preparation completed: Yes  Pain Score: 5      Nutritional Status: BMI > 30  Obese Nutritional Risks: None  What is the last grade level you completed in school?: Bachelor degree  Interpreter Needed?: No  Comments: pt lives with spouse Information entered by :: LPinson, RN  Past Medical History:  Diagnosis Date  . Arthritis   . Back pain    With spinal cord stimulator.  S/p lumbar fusion, diskectomy.  Prev seen by Dr. Vertell Limber   . Diverticulitis   . Diverticulosis   . Erectile dysfunction   . Hyperlipidemia   . Leg paresthesia    L foot numb- since 1999 surgery   Past Surgical History:  Procedure Laterality Date  . APPENDECTOMY    . BACK SURGERY     Lumbar back surgery L3/4 ans L5/S1 rods and screws L3-S1 (Dr Carloyn Manner)  02/29/2008  . LAMINECTOMY      L4/5 1975,  L5/S1 1990  . SHOULDER SURGERY Right   . SPINAL CORD STIMULATOR IMPLANT     Family History  Problem Relation Age of Onset  . Alzheimer's disease Mother   . Dementia Mother   . Heart disease Father   . Hyperlipidemia Father   . Dementia Father   . Colon cancer Brother   . Prostate cancer Neg Hx   . Esophageal cancer Neg Hx   . Rectal cancer Neg Hx   . Stomach cancer Neg Hx    Social History   Socioeconomic History  . Marital status: Married    Spouse name: Not on file  . Number of children: 1  . Years of education: Not on file  . Highest education level: Not on file  Occupational History  . Occupation: retired, because of back  Social Needs  . Financial resource strain: Not on file  . Food insecurity    Worry: Not on file    Inability: Not on file  . Transportation needs    Medical: Not on file    Non-medical: Not on file  Tobacco Use  . Smoking status: Former Research scientist (life sciences)  . Smokeless tobacco: Never Used  Substance and Sexual Activity  . Alcohol use: Not Currently    Alcohol/week: 0.0 standard drinks  . Drug use: No  . Sexual activity: Yes  Lifestyle  .  Physical activity    Days per week: Not on file    Minutes per session: Not on file  . Stress: Not on file  Relationships  . Social Herbalist on phone: Not on file    Gets together: Not on file    Attends religious service: Not on file    Active member of club or organization: Not on file    Attends meetings of clubs or organizations: Not on file    Relationship status: Not on file  Other Topics Concern  . 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 Acetaminophen (TYLENOL EXTRA STRENGTH PO) Take by mouth as needed.  . celecoxib (CELEBREX) 100 MG capsule Take 1 capsule (100 mg total) by mouth 2 (two) times  daily as needed.  . docusate sodium (COLACE) 100 MG capsule Take 100 mg by mouth 2 (two) times daily.    . Omega-3 Fatty Acids (FISH OIL) 1200 MG CAPS Take 1,200 mg by mouth daily.  Marland Kitchen oseltamivir (TAMIFLU) 75 MG capsule Take 1 capsule (75 mg total) by mouth 2 (two) times daily.  . pravastatin (PRAVACHOL) 20 MG tablet Take 1 tablet (20 mg total) by mouth every other day.  . sildenafil (REVATIO) 20 MG tablet Take 3-5 tablets (60-100 mg total) by mouth daily as needed.  Marland Kitchen tiZANidine (ZANAFLEX) 4 MG tablet Take 0.5-1 tablets (2-4 mg total) by mouth every 6 (six) hours as needed for muscle spasms.  . pravastatin (PRAVACHOL) 20 MG tablet TAKE 1 TABLET BY MOUTH  DAILY (Patient not taking: Reported on 04/13/2019)  . [DISCONTINUED] predniSONE (DELTASONE) 20 MG tablet Take 2 a day for 5 days, then 1 a day for 5 days, with food. Don't take with aleve/ibuprofen/celebrex.   No facility-administered encounter medications on file as of 04/13/2019.     Activities of Daily Living In your present state of health, do you have any difficulty performing the following activities: 04/13/2019  Hearing? Y  Vision? N  Difficulty concentrating or making decisions? N  Walking or climbing stairs? N  Dressing or bathing? N  Doing errands, shopping? N  Preparing Food and eating ? N  Using the Toilet? N  In the past six months, have you accidently leaked urine? N  Do you have problems with loss of bowel control? N  Managing your Medications? N  Managing your Finances? N  Housekeeping or managing your Housekeeping? N  Some recent data might be hidden    Patient Care Team: Tonia Ghent, MD as PCP - General Justice Britain, MD as Consulting Physician (Orthopedic Surgery) Erline Levine, MD as Consulting Physician (Neurosurgery)   Assessment:   This is a routine wellness examination for Andrew [name].   Hearing Screening   125Hz  250Hz  500Hz  1000Hz  2000Hz  3000Hz  4000Hz  6000Hz  8000Hz   Right ear:           Left ear:            Vision Screening Comments: Vision exam in 2019   Exercise Activities and Dietary recommendations Current Exercise Habits: The patient does not participate in regular exercise at present, Exercise limited by: orthopedic condition(s)  Goals    . Patient Stated     Starting 04/13/2019, I will continue to take medications as prescribed.        Fall Risk Fall Risk  04/13/2019  04/07/2018 06/03/2017 03/26/2016 03/26/2016  Falls in the past year? 0 No No No No   Depression Screen PHQ 2/9 Scores 04/13/2019 04/07/2018 06/03/2017 03/26/2016  PHQ - 2 Score 0 0 0 0  PHQ- 9 Score 0 0 - -  Exception Documentation Medical reason - - -    Cognitive Function    MMSE - Mini Mental State Exam 04/13/2019 04/07/2018 03/26/2016  Orientation to time 5 5 5   Orientation to Place 5 5 5   Registration 3 3 3   Attention/ Calculation 0 0 0  Recall 3 3 3   Language- name 2 objects 0 0 0  Language- repeat 1 1 1   Language- follow 3 step command 0 3 3  Language- read & follow direction 0 0 0  Write a sentence 0 0 0  Copy design 0 0 0  Total score 17 20 20      PLEASE NOTE: A Mini-Cog screen was completed. Maximum score is 17. A value of 0 denotes this part of Folstein MMSE was not completed or the patient failed this part of the Mini-Cog screening.   Mini-Cog Screening Orientation to Time - Max 5 pts Orientation to Place - Max 5 pts Registration - Max 3 pts Recall - Max 3 pts Language Repeat - Max 1 pts   Immunization History  Administered Date(s) Administered  . Influenza Whole 07/20/2013  . Influenza,inj,Quad PF,6+ Mos 07/13/2017, 07/26/2018  . Influenza-Unspecified 07/13/2017, 07/26/2018  . Td 01/02/2003  . Tdap 05/27/2013    Screening Tests Health Maintenance  Topic Date Due  . INFLUENZA VACCINE  05/21/2019  . COLONOSCOPY  12/26/2019  . TETANUS/TDAP  05/28/2023  . Hepatitis C Screening  Completed  . HIV Screening  Completed       Plan:     I have personally reviewed, addressed, and noted the  following in the patient's chart:  A. Medical and social history B. Use of alcohol, tobacco or illicit drugs  C. Current medications and supplements D. Functional ability and status E.  Nutritional status F.  Physical activity G. Advance directives H. List of other physicians I.  Hospitalizations, surgeries, and ER visits in previous 12 months J.  Vitals (unless it is a telemedicine encounter) K. Screenings to include cognitive, depression, hearing, vision (NOTE: hearing and vision screenings not completed in telemedicine encounter) L. Referrals and appointments   In addition, I have reviewed and discussed with patient certain preventive protocols, quality metrics, and best practice recommendations. A written personalized care plan for preventive services and recommendations were provided to patient.  With patient's permission, we connected on 04/13/19 at 12:00 PM EDT. Interactive audio and video telecommunications were attempted with patient. This attempt was unsuccessful due to patient having technical difficulties OR patient did not have access to video capability.  Encounter was completed with audio only.  Two patient identifiers were used to ensure the encounter occurred with the correct person. Patient was in home and writer was in office.     Signed,   Lindell Noe, MHA, BS, RN Health Coach

## 2019-04-13 NOTE — Patient Instructions (Signed)
Mr. Trost , Thank you for taking time to come for your Medicare Wellness Visit. I appreciate your ongoing commitment to your health goals. Please review the following plan we discussed and let me know if I can assist you in the future.   These are the goals we discussed: Goals    . Patient Stated     Starting 04/13/2019, I will continue to take medications as prescribed.        This is a list of the screening recommended for you and due dates:  Health Maintenance  Topic Date Due  . Flu Shot  05/21/2019  . Colon Cancer Screening  12/26/2019  . Tetanus Vaccine  05/28/2023  .  Hepatitis C: One time screening is recommended by Center for Disease Control  (CDC) for  adults born from [birth year] through 1964.   Completed  . HIV Screening  Completed   Preventive Care for Adults  A healthy lifestyle and preventive care can promote health and wellness. Preventive health guidelines for adults include the following key practices.  . A routine yearly physical is a good way to check with your health care provider about your health and preventive screening. It is a chance to share any concerns and updates on your health and to receive a thorough exam.  . Visit your dentist for a routine exam and preventive care every 6 months. Brush your teeth twice a day and floss once a day. Good oral hygiene prevents tooth decay and gum disease.  . The frequency of eye exams is based on your age, health, family medical history, use  of contact lenses, and other factors. Follow your health care provider's recommendations for frequency of eye exams.  . Eat a healthy diet. Foods like vegetables, fruits, whole grains, low-fat dairy products, and lean protein foods contain the nutrients you need without too many calories. Decrease your intake of foods high in solid fats, added sugars, and salt. Eat the right amount of calories for you. Get information about a proper diet from your health care provider, if necessary.  .  Regular physical exercise is one of the most important things you can do for your health. Most adults should get at least 150 minutes of moderate-intensity exercise (any activity that increases your heart rate and causes you to sweat) each week. In addition, most adults need muscle-strengthening exercises on 2 or more days a week.  Silver Sneakers may be a benefit available to you. To determine eligibility, you may visit the website: www.silversneakers.com or contact program at (726)493-6214 Mon-Fri between 8AM-8PM.   . Maintain a healthy weight. The body mass index (BMI) is a screening tool to identify possible weight problems. It provides an estimate of body fat based on height and weight. Your health care provider can find your BMI and can help you achieve or maintain a healthy weight.   For adults 20 years and older: ? A BMI below 18.5 is considered underweight. ? A BMI of 18.5 to 24.9 is normal. ? A BMI of 25 to 29.9 is considered overweight. ? A BMI of 30 and above is considered obese.   . Maintain normal blood lipids and cholesterol levels by exercising and minimizing your intake of saturated fat. Eat a balanced diet with plenty of fruit and vegetables. Blood tests for lipids and cholesterol should begin at age 38 and be repeated every 5 years. If your lipid or cholesterol levels are high, you are over 50, or you are at high risk for  heart disease, you may need your cholesterol levels checked more frequently. Ongoing high lipid and cholesterol levels should be treated with medicines if diet and exercise are not working.  . If you smoke, find out from your health care provider how to quit. If you do not use tobacco, please do not start.  . If you choose to drink alcohol, please do not consume more than 2 drinks per day. One drink is considered to be 12 ounces (355 mL) of beer, 5 ounces (148 mL) of wine, or 1.5 ounces (44 mL) of liquor.  . If you are 35-66 years old, ask your health care  provider if you should take aspirin to prevent strokes.  . Use sunscreen. Apply sunscreen liberally and repeatedly throughout the day. You should seek shade when your shadow is shorter than you. Protect yourself by wearing long sleeves, pants, a wide-brimmed hat, and sunglasses year round, whenever you are outdoors.  . Once a month, do a whole body skin exam, using a mirror to look at the skin on your back. Tell your health care provider of new moles, moles that have irregular borders, moles that are larger than a pencil eraser, or moles that have changed in shape or color.

## 2019-04-14 NOTE — Progress Notes (Signed)
   Subjective:    Patient ID: Andrew [name], male    DOB: 03-17-1956, 63 y.o.   MRN: [id number]  HPI I reviewed health advisor's note, was available for consultation, and agree with documentation and plan.    Review of Systems     Objective:   Physical Exam         Assessment & Plan:

## 2019-04-18 ENCOUNTER — Other Ambulatory Visit (INDEPENDENT_AMBULATORY_CARE_PROVIDER_SITE_OTHER): Payer: Medicare Other

## 2019-04-18 ENCOUNTER — Other Ambulatory Visit: Payer: Self-pay

## 2019-04-18 ENCOUNTER — Encounter: Payer: Self-pay | Admitting: Family Medicine

## 2019-04-18 ENCOUNTER — Other Ambulatory Visit: Payer: Self-pay | Admitting: Family Medicine

## 2019-04-18 ENCOUNTER — Ambulatory Visit (INDEPENDENT_AMBULATORY_CARE_PROVIDER_SITE_OTHER): Payer: Medicare Other | Admitting: Family Medicine

## 2019-04-18 VITALS — BP 142/80 | HR 89 | Temp 98.2°F | Ht 76.0 in | Wt 217.2 lb

## 2019-04-18 DIAGNOSIS — E7800 Pure hypercholesterolemia, unspecified: Secondary | ICD-10-CM | POA: Diagnosis not present

## 2019-04-18 DIAGNOSIS — N529 Male erectile dysfunction, unspecified: Secondary | ICD-10-CM | POA: Diagnosis not present

## 2019-04-18 DIAGNOSIS — E785 Hyperlipidemia, unspecified: Secondary | ICD-10-CM

## 2019-04-18 DIAGNOSIS — M549 Dorsalgia, unspecified: Secondary | ICD-10-CM | POA: Diagnosis not present

## 2019-04-18 DIAGNOSIS — Z7189 Other specified counseling: Secondary | ICD-10-CM

## 2019-04-18 DIAGNOSIS — Z0000 Encounter for general adult medical examination without abnormal findings: Secondary | ICD-10-CM

## 2019-04-18 LAB — COMPREHENSIVE METABOLIC PANEL
ALT: 26 U/L (ref 0–53)
AST: 18 U/L (ref 0–37)
Albumin: 4.5 g/dL (ref 3.5–5.2)
Alkaline Phosphatase: 52 U/L (ref 39–117)
BUN: 19 mg/dL (ref 6–23)
CO2: 30 mEq/L (ref 19–32)
Calcium: 9.3 mg/dL (ref 8.4–10.5)
Chloride: 106 mEq/L (ref 96–112)
Creatinine, Ser: 0.92 mg/dL (ref 0.40–1.50)
GFR: 83.41 mL/min (ref >60.00)
Glucose, Bld: 102 mg/dL — ABNORMAL HIGH (ref 70–99)
Potassium: 4.7 mEq/L (ref 3.5–5.1)
Sodium: 141 mEq/L (ref 135–145)
Total Bilirubin: 0.8 mg/dL (ref 0.2–1.2)
Total Protein: 6.8 g/dL (ref 6.0–8.3)

## 2019-04-18 LAB — LIPID PANEL
Cholesterol: 185 mg/dL (ref 0–200)
HDL: 47.4 mg/dL (ref >39.00)
LDL Cholesterol: 117 mg/dL — ABNORMAL HIGH (ref 0–99)
NonHDL: 137.15
Total CHOL/HDL Ratio: 4
Triglycerides: 102 mg/dL (ref 0.0–149.0)
VLDL: 20.4 mg/dL (ref 0.0–40.0)

## 2019-04-18 MED ORDER — SILDENAFIL CITRATE 20 MG PO TABS: 60.0000 mg | ORAL_TABLET | Freq: Every day | ORAL | 12 refills | Status: DC | PRN

## 2019-04-18 MED ORDER — PRAVASTATIN SODIUM 20 MG PO TABS: 20.0000 mg | ORAL_TABLET | ORAL | Status: DC

## 2019-04-18 MED ORDER — CELECOXIB 100 MG PO CAPS: 100.0000 mg | ORAL_CAPSULE | Freq: Two times a day (BID) | ORAL | 3 refills | Status: DC | PRN

## 2019-04-18 MED ORDER — PRAVASTATIN SODIUM 20 MG PO TABS: 20.0000 mg | ORAL_TABLET | ORAL | 3 refills | Status: DC

## 2019-04-18 MED ORDER — PREDNISONE 20 MG PO TABS: ORAL_TABLET | ORAL | 0 refills | Status: DC

## 2019-04-18 MED ORDER — TRAMADOL HCL 50 MG PO TABS: 50.0000 mg | ORAL_TABLET | Freq: Three times a day (TID) | ORAL | 1 refills | Status: DC | PRN

## 2019-04-18 MED ORDER — TIZANIDINE HCL 4 MG PO TABS: 2.0000 mg | ORAL_TABLET | Freq: Four times a day (QID) | ORAL | 3 refills | Status: DC | PRN

## 2019-04-18 NOTE — Patient Instructions (Addendum)
Check with your insurance to see if they will cover the shingrix shot. Hold prednisone for now, start if not better.   Use tramadol if needed.  If taking prednisone, hold celebrex.  We'll call about neurosurgery.   Take care.  Glad to see you.  We'll contact you with your lab report.

## 2019-04-18 NOTE — Progress Notes (Signed)
Back pain.  He had steroid taper during his [place] trip, he was nearly back to baseline pain yesterday but was more active yesterday, helping a grandchild with a float and "my back went out."  He has tolerated celebrex 100mg  BID prn, able to tolerate that.  He has been using tizanidine.  No ADE on prednisone, it helped some.  He has seen Dr. Beverly Milch.  He has R>L back pain with prev R sided sciatica, less with prednisone use now.  No new weakness- he has some L foot weakness at baseline.  No FCNAVD.  No B/B sx.  He can't have MRI due to simulator placement.  He has L foot numbness at baseline.      ED.  No ADE on sildenafil.  It worked.    Elevated Cholesterol: Using medications without problems: Muscle aches: not from statin, he can tolerate pravastatin QOD.   Diet compliance: yes Exercise: limited by back pain.    Tetanus 2014 PNA not due Asked pt to check on coverage for shingrix.   Flu shot done yearly.   Prostate cancer screening and PSA options (with potential risks and benefits of testing vs not testing) were discussed along with recent recs/guidelines.  He declined testing PSA at this point. Colon 2016 Living will d/w pt.  Wife [name] designated if patient were incapacitated.    Pandemic considerations.  D/w pt.    PMH and SH reviewed  ROS: Per HPI unless specifically indicated in ROS section   Meds, vitals, and allergies reviewed.   GEN: nad, alert and oriented HEENT: ncat NECK: supple w/o LA CV: rrr.   PULM: ctab, no inc wob ABD: soft, +bs EXT: no edema SKIN: no acute rash L foot mildly weak with plantar and dorsiflexion.  L foot paresthesia.  R foot normal S/S.  R SLR with + back pain but not sciatica.   Normal DP pulses B

## 2019-04-20 NOTE — Assessment & Plan Note (Signed)
he can tolerate pravastatin QOD.  Continue as is.  See notes on labs.

## 2019-04-20 NOTE — Assessment & Plan Note (Signed)
Continue prn sildenafil.  

## 2019-04-20 NOTE — Assessment & Plan Note (Signed)
Living will d/w pt.  Wife Judy designated if patient were incapacitated.   

## 2019-04-20 NOTE — Assessment & Plan Note (Signed)
Tetanus 2014 PNA not due Asked pt to check on coverage for shingrix.   Flu shot done yearly.   Prostate cancer screening and PSA options (with potential risks and benefits of testing vs not testing) were discussed along with recent recs/guidelines.  He declined testing PSA at this point. Colon 2016 Living will d/w pt.  Wife [name] designated if patient were incapacitated.

## 2019-04-20 NOTE — Assessment & Plan Note (Addendum)
Hold prednisone for now, will start if not better.   Use tramadol if needed.  If taking prednisone, hold celebrex.  Routine steroid cautions discussed with patient. Refer to neurosurgery. >25 minutes spent in face to face time with patient, >50% spent in counselling or coordination of care

## 2019-06-21 ENCOUNTER — Other Ambulatory Visit: Payer: Self-pay | Admitting: Neurosurgery

## 2019-06-21 ENCOUNTER — Telehealth: Payer: Self-pay | Admitting: Nurse Practitioner

## 2019-06-21 DIAGNOSIS — M5416 Radiculopathy, lumbar region: Secondary | ICD-10-CM

## 2019-06-21 NOTE — Telephone Encounter (Signed)
Phone call to patient to verify medication list and allergies for myelogram procedure. Pt instructed to hold Tramadol for 48hrs prior to myelogram appointment time. Pt verbalized understanding. Pre and post procedure instructions reviewed with pt. 

## 2019-06-29 ENCOUNTER — Ambulatory Visit
Admission: RE | Admit: 2019-06-29 | Discharge: 2019-06-29 | Disposition: A | Discharge status: Home / Self Care | Payer: Medicare Other | Source: Ambulatory Visit | Attending: Neurosurgery | Admitting: Neurosurgery

## 2019-06-29 DIAGNOSIS — M5416 Radiculopathy, lumbar region: Secondary | ICD-10-CM

## 2019-06-29 MED ORDER — DIAZEPAM 5 MG PO TABS
10.0000 mg | ORAL_TABLET | Freq: Once | ORAL | Status: AC
  Administered 2019-06-29: 09:00:00 10 mg via ORAL

## 2019-06-29 MED ORDER — IOPAMIDOL (ISOVUE-M 300) INJECTION 61%
10.0000 mL | Freq: Once | INTRAMUSCULAR | Status: AC | PRN
  Administered 2019-06-29: 10:00:00 10 mL via INTRATHECAL

## 2019-06-29 NOTE — Progress Notes (Signed)
Pt reports he has been off his tramadol for at least 48hrs for his myelogram procedure today.

## 2019-06-29 NOTE — Discharge Instructions (Signed)

## 2019-07-28 ENCOUNTER — Other Ambulatory Visit: Payer: Self-pay | Admitting: Neurosurgery

## 2019-08-01 NOTE — H&P (Signed)
Patient ID:   [id number] Patient: Andrew [name]  Date of Birth: June 20, 1956 Visit Type: Office Visit   Date: 07/14/2019 10:30 AM Provider: Marchia Meiers. Vertell Limber MD   This 63 year old male presents for back pain.  HISTORY OF PRESENT ILLNESS:  1.  back pain  I have reviewed the patient's imaging with him.  The show L2-3 moderate to severe spinal stenosis.  There is milder degeneration at L1-2 with a disc herniation at L1-2 on the left with some effacement of the left L2 nerve root.  He says he gets some relief with sitting but that this is not completely relieve his pain.  I have recommended due to the patient that he undergo bilateral L2-3 epidural steroid injections.  He will come back see me after he is done so.  Does not get relief with injections we may proceed with surgical intervention.  I am reluctant to recommend doing anything at the L1-2 level the think we could proceed with XL I L2-3 with lateral plate at this level.         Medical/Surgical/Interim History Reviewed, no change.     PAST MEDICAL HISTORY, SURGICAL HISTORY, FAMILY HISTORY, SOCIAL HISTORY AND REVIEW OF SYSTEMS I have reviewed the patient's past medical, surgical, family and social history as well as the comprehensive review of systems as included on the [facility] history form dated 04/27/2019, which I have signed.  Family History:  Reviewed, no changes.    Social History: Reviewed, no changes.   MEDICATIONS: (added, continued or stopped this visit) Started Medication Directions Instruction Stopped   Celebrex 100 mg capsule take 1 capsule by oral route 2 times every day     fish oil 1200 ORAL TABLET      pravastatin 20 mg tablet take 1 tablet by oral route  every day     tizanidine 4 mg tablet take 1 tablet by oral route  1 - 3 times every day     tramadol 50 mg tablet take 1 tablet by oral route  every 6 hours as needed       ALLERGIES: Ingredient Reaction Medication Name  Comment  NO KNOWN ALLERGIES     No known allergies.    PHYSICAL EXAM:   Vitals Date Temp F BP Pulse Ht In Wt Lb BMI BSA Pain Score  07/14/2019 97.2 145/84 91 77 222 26.33  7/10      IMPRESSION:   Severe spinal stenosis L2-3 with milder degenerative changes at the L1-2 level.  We will proceed with bilateral L2-3 epidural steroid injection.  Patient will follow-up with me after that.  PLAN:  Follow-up with me after lumbar injection.  Orders: Office Procedures/Services: Assessment Service Comments  M48.061 Lumbar Spine - Transforaminal - bilateral - L2-L3    Instruction(s)/Education: Assessment Instruction  R03.0 Hypertension education  [id number] Dietary management education, guidance, and counseling   Completed Orders (this encounter) Order Details Reason Side Interpretation Result Initial Treatment Date Region  Dietary management education, guidance, and counseling Eat a well rounded diet high in fruit and vegetables        Hypertension education Continue to monitor blood pressure and if it remains elevated contact primary physician         Assessment/Plan   # Detail Type Description   1. Assessment Postlaminectomy syndrome, lumbar region (M96.1).       2. Assessment Low back pain, unspecified back pain laterality, with sciatica presence unspecified (M54.5).  3. Assessment Radiculopathy, lumbar region (M54.16).       4. Assessment Degenerative lumbar spinal stenosis (M48.061).   Plan Orders Lumbar Spine - Transforaminal - bilateral - L2-L3.       5. Assessment Body mass index (BMI) 26.0-26.9, adult [place]:[id number]).   Plan Orders Today's instructions / counseling include(s) Dietary management education, guidance, and counseling. Clinical information/comments: Eat a well rounded diet high in fruit and vegetables.       6. Assessment Elevated blood-pressure reading, w/o diagnosis of htn (R03.0).         Pain Management Plan Pain Scale: 7/10. Method: Numeric Pain  Intensity Scale. Location: back. Onset: 04/17/2019. Duration: varies. Quality: discomforting. Pain management follow-up plan of care: Patient is to use pain medication as directed.              Provider:  Marchia Meiers. Vertell Limber MD  07/17/2019 02:38 PM    Dictation edited by: Marchia Meiers. Vertell Limber    CC Providers: Erline Levine MD  [address] [place], [place] [address]               Electronically signed by Marchia Meiers. Vertell Limber MD on 07/17/2019 02:39 PM

## 2019-08-11 NOTE — Pre-Procedure Instructions (Signed)
[facility] [name] [id number] Phone: [phone] Fax: [phone]      Your procedure is scheduled on  08-16-19  Report to [facility] Main Entrance "A" at 9A.M., and check in at the Admitting office.  Call this number if you have problems the morning of surgery:  [phone]  Call [phone] if you have any questions prior to your surgery date Monday-Friday 8am-4pm    Remember:  Do not eat or drink after midnight the night before your surgery  Take these medicines the morning of surgery with A SIP OF WATER: pravastatin (PRAVACHOL) acetaminophen (TYLENOL)as needed tiZANidine (ZANAFLEX)as needed traMADol (ULTRAM)as needed  7 days prior to surgery STOP taking any CELEBREX, Aspirin (unless otherwise instructed by your surgeon), Aleve, Naproxen, Ibuprofen, Motrin, Advil, Goody's, BC's, all herbal medications, fish oil, and all vitamins.    The Morning of Surgery  Do not wear jewelry, make-up or nail polish.  Do not wear lotions, powders, or perfumes/colognes, or deodorant  .  Men may shave face and neck.  Do not bring valuables to the hospital.  [facility] is not responsible for any belongings or valuables.  If you are a smoker, DO NOT Smoke 24 hours prior to surgery IF you wear a CPAP at night please bring your mask, tubing, and machine the morning of surgery   Remember that you must have someone to transport you home after your surgery, and remain with you for 24 hours if you are discharged the same day.   Contacts, glasses, hearing aids, dentures or bridgework may not be worn into surgery.    Leave your suitcase in the car.  After surgery it may be brought to your room.  For patients admitted to the hospital, discharge time will be determined by your treatment team.  Patients discharged the day of surgery will not be allowed to drive home.    Special instructions:   [place]-  Preparing For Surgery  Before surgery, you can play an important role. Because skin is not sterile, your skin needs to be as free of germs as possible. You can reduce the number of germs on your skin by washing with CHG (chlorahexidine gluconate) Soap before surgery.  CHG is an antiseptic cleaner which kills germs and bonds with the skin to continue killing germs even after washing.    Oral Hygiene is also important to reduce your risk of infection.  Remember - BRUSH YOUR TEETH THE MORNING OF SURGERY WITH YOUR REGULAR TOOTHPASTE  Please do not use if you have an allergy to CHG or antibacterial soaps. If your skin becomes reddened/irritated stop using the CHG.  Do not shave (including legs and underarms) for at least 48 hours prior to first CHG shower. It is OK to shave your face.  Please follow these instructions carefully.   1. Shower the NIGHT BEFORE SURGERY and the MORNING OF SURGERY with CHG Soap.   2. If you chose to wash your hair, wash your hair first as usual with your normal shampoo.  3. After you shampoo, rinse your hair and body thoroughly to remove the shampoo.  4. Use CHG as you would any other liquid soap. You can apply CHG directly to the skin and wash gently with a scrungie or a clean washcloth.   5. Apply the CHG Soap to your body ONLY FROM THE NECK DOWN.  Do not use on open wounds or open sores.  Avoid contact with your eyes, ears, mouth and genitals (private parts). Wash Face and genitals (private parts)  with your normal soap.   6. Wash thoroughly, paying special attention to the area where your surgery will be performed.  7. Thoroughly rinse your body with warm water from the neck down.  8. DO NOT shower/wash with your normal soap after using and rinsing off the CHG Soap.  9. Pat yourself dry with a CLEAN TOWEL.  10. Wear CLEAN PAJAMAS to bed the night before surgery, wear comfortable clothes the morning of surgery  11. Place CLEAN SHEETS on your bed the night of  your first shower and DO NOT SLEEP WITH PETS.  Day of Surgery:  Do not apply any deodorants/lotions. Please shower the morning of surgery with the CHG soap  Please wear clean clothes to the hospital/surgery center.   Remember to brush your teeth WITH YOUR REGULAR TOOTHPASTE.   Please read over the  fact sheets that you were given.

## 2019-08-12 ENCOUNTER — Encounter (HOSPITAL_COMMUNITY): Payer: Self-pay

## 2019-08-12 ENCOUNTER — Other Ambulatory Visit: Payer: Self-pay

## 2019-08-12 ENCOUNTER — Other Ambulatory Visit (HOSPITAL_COMMUNITY)
Admission: RE | Admit: 2019-08-12 | Discharge: 2019-08-12 | Disposition: A | Discharge status: Home / Self Care | Payer: Medicare Other | Source: Ambulatory Visit | Attending: Neurosurgery | Admitting: Neurosurgery

## 2019-08-12 ENCOUNTER — Encounter (HOSPITAL_COMMUNITY)
Admission: RE | Admit: 2019-08-12 | Discharge: 2019-08-12 | Disposition: A | Discharge status: Home / Self Care | Payer: Medicare Other | Source: Ambulatory Visit | Attending: Neurosurgery | Admitting: Neurosurgery

## 2019-08-12 DIAGNOSIS — M48061 Spinal stenosis, lumbar region without neurogenic claudication: Secondary | ICD-10-CM | POA: Insufficient documentation

## 2019-08-12 DIAGNOSIS — Z01812 Encounter for preprocedural laboratory examination: Secondary | ICD-10-CM | POA: Insufficient documentation

## 2019-08-12 HISTORY — DX: Other specified postprocedural states: Z98.890

## 2019-08-12 HISTORY — DX: Other complications of anesthesia, initial encounter: T88.59XA

## 2019-08-12 HISTORY — DX: Nausea with vomiting, unspecified: R11.2

## 2019-08-12 LAB — CBC
HCT: 46.4 % (ref 39.0–52.0)
Hemoglobin: 16.2 g/dL (ref 13.0–17.0)
MCH: 31.2 pg (ref 26.0–34.0)
MCHC: 34.9 g/dL (ref 30.0–36.0)
MCV: 89.2 fL (ref 80.0–100.0)
Platelets: 153 10*3/uL (ref 150–400)
RBC: 5.2 MIL/uL (ref 4.22–5.81)
RDW: 12.3 % (ref 11.5–15.5)
WBC: 6.9 10*3/uL (ref 4.0–10.5)
nRBC: 0 % (ref 0.0–0.2)

## 2019-08-12 LAB — TYPE AND SCREEN
ABO/RH(D): O POS
Antibody Screen: NEGATIVE

## 2019-08-12 LAB — ABO/RH: ABO/RH(D): O POS

## 2019-08-12 NOTE — Progress Notes (Signed)
PCP:  Elsie Stain, MD Cardiologist:  denies  EKG:  N/A CXR:  N/A ECHO:  denies Stress Test:  denies Cardiac Cath:  denies  Covid test today, 08/12/19  Patient denies shortness of breath, fever, cough, and chest pain at PAT appointment.  Patient verbalized understanding of instructions provided today at the PAT appointment.  Patient asked to review instructions at home and day of surgery.

## 2019-08-13 LAB — NOVEL CORONAVIRUS, NAA (HOSP ORDER, SEND-OUT TO REF LAB; TAT 18-24 HRS): SARS-CoV-2, NAA: NOT DETECTED

## 2019-08-16 ENCOUNTER — Inpatient Hospital Stay (HOSPITAL_COMMUNITY): Payer: Medicare Other

## 2019-08-16 ENCOUNTER — Inpatient Hospital Stay (HOSPITAL_COMMUNITY): Payer: Medicare Other | Admitting: Anesthesiology

## 2019-08-16 ENCOUNTER — Encounter (HOSPITAL_COMMUNITY): Payer: Self-pay

## 2019-08-16 ENCOUNTER — Inpatient Hospital Stay (HOSPITAL_COMMUNITY)
Admission: RE | Admit: 2019-08-16 | Discharge: 2019-08-17 | DRG: 460 | Disposition: A | Discharge status: Home / Self Care | Payer: Medicare Other | Attending: Neurosurgery | Admitting: Neurosurgery

## 2019-08-16 ENCOUNTER — Other Ambulatory Visit: Payer: Self-pay

## 2019-08-16 ENCOUNTER — Encounter (HOSPITAL_COMMUNITY)
Admission: RE | Disposition: A | Discharge status: Home / Self Care | Payer: Self-pay | Source: Home / Self Care | Attending: Neurosurgery

## 2019-08-16 DIAGNOSIS — M5116 Intervertebral disc disorders with radiculopathy, lumbar region: Principal | ICD-10-CM | POA: Diagnosis present

## 2019-08-16 DIAGNOSIS — Z20828 Contact with and (suspected) exposure to other viral communicable diseases: Secondary | ICD-10-CM | POA: Diagnosis present

## 2019-08-16 DIAGNOSIS — Z419 Encounter for procedure for purposes other than remedying health state, unspecified: Secondary | ICD-10-CM

## 2019-08-16 DIAGNOSIS — M48061 Spinal stenosis, lumbar region without neurogenic claudication: Secondary | ICD-10-CM | POA: Diagnosis present

## 2019-08-16 DIAGNOSIS — I10 Essential (primary) hypertension: Secondary | ICD-10-CM | POA: Diagnosis present

## 2019-08-16 DIAGNOSIS — M4316 Spondylolisthesis, lumbar region: Secondary | ICD-10-CM | POA: Diagnosis present

## 2019-08-16 DIAGNOSIS — Z79899 Other long term (current) drug therapy: Secondary | ICD-10-CM | POA: Diagnosis not present

## 2019-08-16 HISTORY — PX: ANTERIOR LAT LUMBAR FUSION: SHX1168

## 2019-08-16 SURGERY — ANTERIOR LATERAL LUMBAR FUSION 1 LEVEL
Anesthesia: General | Site: Spine Lumbar | Laterality: Right

## 2019-08-16 MED ORDER — PROPOFOL 500 MG/50ML IV EMUL
INTRAVENOUS | Status: DC | PRN
  Administered 2019-08-16: 50 ug/kg/min via INTRAVENOUS

## 2019-08-16 MED ORDER — KCL IN DEXTROSE-NACL 20-5-0.45 MEQ/L-%-% IV SOLN: INTRAVENOUS | Status: DC

## 2019-08-16 MED ORDER — PANTOPRAZOLE SODIUM 40 MG IV SOLR
40.0000 mg | Freq: Every day | INTRAVENOUS | Status: DC
  Administered 2019-08-16: 40 mg via INTRAVENOUS
  Filled 2019-08-16: qty 40

## 2019-08-16 MED ORDER — SODIUM CHLORIDE 0.9 % IV SOLN
INTRAVENOUS | Status: DC | PRN
  Administered 2019-08-16: 12:00:00 20 ug/min via INTRAVENOUS

## 2019-08-16 MED ORDER — METHOCARBAMOL 1000 MG/10ML IJ SOLN
500.0000 mg | Freq: Four times a day (QID) | INTRAVENOUS | Status: DC | PRN
  Administered 2019-08-16: 500 mg via INTRAVENOUS
  Filled 2019-08-16: qty 500
  Filled 2019-08-16: qty 5

## 2019-08-16 MED ORDER — FLEET ENEMA 7-19 GM/118ML RE ENEM: 1.0000 | ENEMA | Freq: Once | RECTAL | Status: DC | PRN

## 2019-08-16 MED ORDER — ACETAMINOPHEN 500 MG PO TABS: 1000.0000 mg | ORAL_TABLET | Freq: Three times a day (TID) | ORAL | Status: DC | PRN

## 2019-08-16 MED ORDER — CEFAZOLIN SODIUM-DEXTROSE 2-4 GM/100ML-% IV SOLN
INTRAVENOUS | Status: AC
  Filled 2019-08-16: qty 100

## 2019-08-16 MED ORDER — SODIUM CHLORIDE 0.9 % IV SOLN: 250.0000 mL | INTRAVENOUS | Status: DC

## 2019-08-16 MED ORDER — LIDOCAINE-EPINEPHRINE 1 %-1:100000 IJ SOLN
INTRAMUSCULAR | Status: DC | PRN
  Administered 2019-08-16: 5 mL

## 2019-08-16 MED ORDER — CEFAZOLIN SODIUM-DEXTROSE 2-4 GM/100ML-% IV SOLN
2.0000 g | INTRAVENOUS | Status: AC
  Administered 2019-08-16: 12:00:00 2 g via INTRAVENOUS

## 2019-08-16 MED ORDER — LACTATED RINGERS IV SOLN
INTRAVENOUS | Status: DC
  Administered 2019-08-16: 10:00:00 via INTRAVENOUS

## 2019-08-16 MED ORDER — ACETAMINOPHEN 500 MG PO TABS
ORAL_TABLET | ORAL | Status: AC
  Administered 2019-08-16: 1000 mg via ORAL
  Filled 2019-08-16: qty 2

## 2019-08-16 MED ORDER — CEFAZOLIN SODIUM-DEXTROSE 2-4 GM/100ML-% IV SOLN
2.0000 g | Freq: Three times a day (TID) | INTRAVENOUS | Status: AC
  Administered 2019-08-16 – 2019-08-17 (×2): 2 g via INTRAVENOUS
  Filled 2019-08-16 (×2): qty 100

## 2019-08-16 MED ORDER — TRAMADOL HCL 50 MG PO TABS: 50.0000 mg | ORAL_TABLET | Freq: Three times a day (TID) | ORAL | Status: DC | PRN

## 2019-08-16 MED ORDER — ONDANSETRON HCL 4 MG/2ML IJ SOLN
4.0000 mg | Freq: Four times a day (QID) | INTRAMUSCULAR | Status: DC | PRN
  Administered 2019-08-16: 4 mg via INTRAVENOUS
  Filled 2019-08-16: qty 2

## 2019-08-16 MED ORDER — METHOCARBAMOL 500 MG PO TABS
ORAL_TABLET | ORAL | Status: AC
  Filled 2019-08-16: qty 1

## 2019-08-16 MED ORDER — SODIUM CHLORIDE 0.9% FLUSH
3.0000 mL | Freq: Two times a day (BID) | INTRAVENOUS | Status: DC
  Administered 2019-08-16: 3 mL via INTRAVENOUS

## 2019-08-16 MED ORDER — TIZANIDINE HCL 4 MG PO TABS: 2.0000 mg | ORAL_TABLET | Freq: Four times a day (QID) | ORAL | Status: DC | PRN

## 2019-08-16 MED ORDER — GLYCOPYRROLATE 0.2 MG/ML IJ SOLN
INTRAMUSCULAR | Status: DC | PRN
  Administered 2019-08-16: 0.1 mg via INTRAVENOUS

## 2019-08-16 MED ORDER — OXYCODONE HCL 5 MG PO TABS
10.0000 mg | ORAL_TABLET | ORAL | Status: DC | PRN
  Administered 2019-08-16 – 2019-08-17 (×5): 10 mg via ORAL
  Filled 2019-08-16 (×5): qty 2

## 2019-08-16 MED ORDER — HYDROCODONE-ACETAMINOPHEN 5-325 MG PO TABS
1.0000 | ORAL_TABLET | ORAL | Status: DC | PRN
  Administered 2019-08-16: 1 via ORAL
  Filled 2019-08-16: qty 1

## 2019-08-16 MED ORDER — MENTHOL 3 MG MT LOZG: 1.0000 | LOZENGE | OROMUCOSAL | Status: DC | PRN

## 2019-08-16 MED ORDER — DEXAMETHASONE SODIUM PHOSPHATE 10 MG/ML IJ SOLN
INTRAMUSCULAR | Status: DC | PRN
  Administered 2019-08-16: 10 mg via INTRAVENOUS

## 2019-08-16 MED ORDER — ACETAMINOPHEN 650 MG RE SUPP: 650.0000 mg | RECTAL | Status: DC | PRN

## 2019-08-16 MED ORDER — HYDROMORPHONE HCL 1 MG/ML IJ SOLN
0.5000 mg | INTRAMUSCULAR | Status: DC | PRN
  Administered 2019-08-16: 1 mg via INTRAVENOUS
  Filled 2019-08-16: qty 1

## 2019-08-16 MED ORDER — BUPIVACAINE HCL (PF) 0.5 % IJ SOLN
INTRAMUSCULAR | Status: DC | PRN
  Administered 2019-08-16: 5 mL

## 2019-08-16 MED ORDER — ONDANSETRON HCL 4 MG PO TABS: 4.0000 mg | ORAL_TABLET | Freq: Four times a day (QID) | ORAL | Status: DC | PRN

## 2019-08-16 MED ORDER — CALCIUM CARBONATE ANTACID 500 MG PO CHEW: 1.0000 | CHEWABLE_TABLET | Freq: Every day | ORAL | Status: DC | PRN

## 2019-08-16 MED ORDER — SUCCINYLCHOLINE CHLORIDE 20 MG/ML IJ SOLN
INTRAMUSCULAR | Status: DC | PRN
  Administered 2019-08-16: 100 mg via INTRAVENOUS

## 2019-08-16 MED ORDER — MIDAZOLAM HCL 2 MG/2ML IJ SOLN
INTRAMUSCULAR | Status: AC
  Filled 2019-08-16: qty 2

## 2019-08-16 MED ORDER — DOCUSATE SODIUM 100 MG PO CAPS
100.0000 mg | ORAL_CAPSULE | Freq: Two times a day (BID) | ORAL | Status: DC
  Administered 2019-08-16 (×2): 100 mg via ORAL
  Filled 2019-08-16: qty 1

## 2019-08-16 MED ORDER — HYDROMORPHONE HCL 1 MG/ML IJ SOLN
INTRAMUSCULAR | Status: AC
  Administered 2019-08-16: 0.5 mg via INTRAVENOUS
  Filled 2019-08-16: qty 1

## 2019-08-16 MED ORDER — SODIUM CHLORIDE 0.9% FLUSH: 3.0000 mL | INTRAVENOUS | Status: DC | PRN

## 2019-08-16 MED ORDER — FENTANYL CITRATE (PF) 250 MCG/5ML IJ SOLN
INTRAMUSCULAR | Status: DC | PRN
  Administered 2019-08-16: 50 ug via INTRAVENOUS
  Administered 2019-08-16: 100 ug via INTRAVENOUS

## 2019-08-16 MED ORDER — ZOLPIDEM TARTRATE 5 MG PO TABS: 5.0000 mg | ORAL_TABLET | Freq: Every evening | ORAL | Status: DC | PRN

## 2019-08-16 MED ORDER — HYDROMORPHONE HCL 1 MG/ML IJ SOLN
0.2500 mg | INTRAMUSCULAR | Status: DC | PRN
  Administered 2019-08-16 (×4): 0.5 mg via INTRAVENOUS

## 2019-08-16 MED ORDER — MORPHINE SULFATE (PF) 2 MG/ML IV SOLN
2.0000 mg | INTRAVENOUS | Status: DC | PRN
  Administered 2019-08-16: 2 mg via INTRAVENOUS
  Filled 2019-08-16: qty 1

## 2019-08-16 MED ORDER — 0.9 % SODIUM CHLORIDE (POUR BTL) OPTIME
TOPICAL | Status: DC | PRN
  Administered 2019-08-16: 1000 mL

## 2019-08-16 MED ORDER — LIDOCAINE 2% (20 MG/ML) 5 ML SYRINGE
INTRAMUSCULAR | Status: DC | PRN
  Administered 2019-08-16: 60 mg via INTRAVENOUS

## 2019-08-16 MED ORDER — THROMBIN 5000 UNITS EX SOLR
OROMUCOSAL | Status: DC | PRN
  Administered 2019-08-16: 13:00:00 via TOPICAL

## 2019-08-16 MED ORDER — PROPOFOL 10 MG/ML IV BOLUS
INTRAVENOUS | Status: AC
  Filled 2019-08-16: qty 20

## 2019-08-16 MED ORDER — FENTANYL CITRATE (PF) 250 MCG/5ML IJ SOLN
INTRAMUSCULAR | Status: AC
  Filled 2019-08-16: qty 5

## 2019-08-16 MED ORDER — CHLORHEXIDINE GLUCONATE CLOTH 2 % EX PADS: 6.0000 | MEDICATED_PAD | Freq: Once | CUTANEOUS | Status: DC

## 2019-08-16 MED ORDER — BISACODYL 10 MG RE SUPP: 10.0000 mg | Freq: Every day | RECTAL | Status: DC | PRN

## 2019-08-16 MED ORDER — BUPIVACAINE HCL (PF) 0.5 % IJ SOLN
INTRAMUSCULAR | Status: AC
  Filled 2019-08-16: qty 30

## 2019-08-16 MED ORDER — ACETAMINOPHEN 325 MG PO TABS
650.0000 mg | ORAL_TABLET | ORAL | Status: DC | PRN
  Administered 2019-08-16: 650 mg via ORAL
  Filled 2019-08-16: qty 2

## 2019-08-16 MED ORDER — PHENYLEPHRINE 40 MCG/ML (10ML) SYRINGE FOR IV PUSH (FOR BLOOD PRESSURE SUPPORT)
PREFILLED_SYRINGE | INTRAVENOUS | Status: DC | PRN
  Administered 2019-08-16: 120 ug via INTRAVENOUS

## 2019-08-16 MED ORDER — HYDROXYZINE HCL 50 MG/ML IM SOLN: 50.0000 mg | Freq: Four times a day (QID) | INTRAMUSCULAR | Status: DC | PRN

## 2019-08-16 MED ORDER — PHENOL 1.4 % MT LIQD: 1.0000 | OROMUCOSAL | Status: DC | PRN

## 2019-08-16 MED ORDER — DOCUSATE SODIUM 50 MG PO CAPS
50.0000 mg | ORAL_CAPSULE | Freq: Two times a day (BID) | ORAL | Status: DC | PRN
  Filled 2019-08-16: qty 1

## 2019-08-16 MED ORDER — PRAVASTATIN SODIUM 10 MG PO TABS: 20.0000 mg | ORAL_TABLET | ORAL | Status: DC

## 2019-08-16 MED ORDER — ALUM & MAG HYDROXIDE-SIMETH 200-200-20 MG/5ML PO SUSP
30.0000 mL | Freq: Four times a day (QID) | ORAL | Status: DC | PRN
  Administered 2019-08-16: 30 mL via ORAL
  Filled 2019-08-16: qty 30

## 2019-08-16 MED ORDER — THROMBIN 5000 UNITS EX SOLR
CUTANEOUS | Status: AC
  Filled 2019-08-16: qty 5000

## 2019-08-16 MED ORDER — MIDAZOLAM HCL 2 MG/2ML IJ SOLN
INTRAMUSCULAR | Status: DC | PRN
  Administered 2019-08-16: 2 mg via INTRAVENOUS

## 2019-08-16 MED ORDER — PROPOFOL 10 MG/ML IV BOLUS
INTRAVENOUS | Status: DC | PRN
  Administered 2019-08-16: 150 mg via INTRAVENOUS
  Administered 2019-08-16: 30 mg via INTRAVENOUS

## 2019-08-16 MED ORDER — ACETAMINOPHEN 500 MG PO TABS
1000.0000 mg | ORAL_TABLET | Freq: Once | ORAL | Status: AC
  Administered 2019-08-16: 10:00:00 1000 mg via ORAL

## 2019-08-16 MED ORDER — LIDOCAINE-EPINEPHRINE 1 %-1:100000 IJ SOLN
INTRAMUSCULAR | Status: AC
  Filled 2019-08-16: qty 1

## 2019-08-16 MED ORDER — ONDANSETRON HCL 4 MG/2ML IJ SOLN
INTRAMUSCULAR | Status: DC | PRN
  Administered 2019-08-16: 4 mg via INTRAVENOUS

## 2019-08-16 MED ORDER — LACTATED RINGERS IV SOLN
INTRAVENOUS | Status: DC | PRN
  Administered 2019-08-16: 12:00:00 via INTRAVENOUS

## 2019-08-16 MED ORDER — POLYETHYLENE GLYCOL 3350 17 G PO PACK: 17.0000 g | PACK | Freq: Every day | ORAL | Status: DC | PRN

## 2019-08-16 MED ORDER — PROMETHAZINE HCL 25 MG/ML IJ SOLN: 6.2500 mg | INTRAMUSCULAR | Status: DC | PRN

## 2019-08-16 MED ORDER — METHOCARBAMOL 500 MG PO TABS
500.0000 mg | ORAL_TABLET | Freq: Four times a day (QID) | ORAL | Status: DC | PRN
  Administered 2019-08-16 – 2019-08-17 (×2): 500 mg via ORAL
  Filled 2019-08-16: qty 1

## 2019-08-16 SURGICAL SUPPLY — 53 items
ADH SKN CLS APL DERMABOND .7 (GAUZE/BANDAGES/DRESSINGS) ×1
BOLT PLATE XLIF 5.5X55 LRG (Bolt) ×2 IMPLANT
BOLT PLATE XLIF 5.5X55MM LRG (Bolt) ×2 IMPLANT
CAGE COROENT XL WIDE14X22X55 (Cage) ×2 IMPLANT
CARTRIDGE OIL MAESTRO DRILL (MISCELLANEOUS) ×1 IMPLANT
DERMABOND ADVANCED (GAUZE/BANDAGES/DRESSINGS) ×2
DERMABOND ADVANCED .7 DNX12 (GAUZE/BANDAGES/DRESSINGS) ×2 IMPLANT
DIFFUSER DRILL AIR PNEUMATIC (MISCELLANEOUS) ×3 IMPLANT
DRAPE C-ARM 42X72 X-RAY (DRAPES) ×3 IMPLANT
DRAPE C-ARMOR (DRAPES) ×3 IMPLANT
DRAPE LAPAROTOMY 100X72X124 (DRAPES) ×3 IMPLANT
DRSG OPSITE POSTOP 3X4 (GAUZE/BANDAGES/DRESSINGS) ×4 IMPLANT
DURAPREP 26ML APPLICATOR (WOUND CARE) ×3 IMPLANT
ELECT REM PT RETURN 9FT ADLT (ELECTROSURGICAL) ×3
ELECTRODE REM PT RTRN 9FT ADLT (ELECTROSURGICAL) ×1 IMPLANT
GAUZE 4X4 16PLY RFD (DISPOSABLE) IMPLANT
GLOVE BIO SURGEON STRL SZ8 (GLOVE) ×3 IMPLANT
GLOVE BIOGEL PI IND STRL 8 (GLOVE) ×1 IMPLANT
GLOVE BIOGEL PI IND STRL 8.5 (GLOVE) ×1 IMPLANT
GLOVE BIOGEL PI INDICATOR 8 (GLOVE) ×8
GLOVE BIOGEL PI INDICATOR 8.5 (GLOVE) ×2
GLOVE ECLIPSE 7.5 STRL STRAW (GLOVE) ×6 IMPLANT
GLOVE ECLIPSE 8.0 STRL XLNG CF (GLOVE) ×3 IMPLANT
GOWN STRL REUS W/ TWL LRG LVL3 (GOWN DISPOSABLE) IMPLANT
GOWN STRL REUS W/ TWL XL LVL3 (GOWN DISPOSABLE) ×2 IMPLANT
GOWN STRL REUS W/TWL 2XL LVL3 (GOWN DISPOSABLE) ×4 IMPLANT
GOWN STRL REUS W/TWL LRG LVL3 (GOWN DISPOSABLE) ×6
GOWN STRL REUS W/TWL XL LVL3 (GOWN DISPOSABLE) ×6
HEMOSTAT POWDER KIT SURGIFOAM (HEMOSTASIS) ×2 IMPLANT
KIT BASIN OR (CUSTOM PROCEDURE TRAY) ×3 IMPLANT
KIT DILATOR XLIF 5 (KITS) ×1 IMPLANT
KIT INFUSE XX SMALL 0.7CC (Orthopedic Implant) ×2 IMPLANT
KIT SURGICAL ACCESS MAXCESS 4 (KITS) ×2 IMPLANT
KIT TURNOVER KIT B (KITS) ×3 IMPLANT
KIT XLIF (KITS) ×1
MODULE NVM5 NEXT GEN EMG (NEEDLE) ×2 IMPLANT
NDL HYPO 25X1 1.5 SAFETY (NEEDLE) ×1 IMPLANT
NEEDLE HYPO 25X1 1.5 SAFETY (NEEDLE) ×3 IMPLANT
NS IRRIG 1000ML POUR BTL (IV SOLUTION) ×3 IMPLANT
OIL CARTRIDGE MAESTRO DRILL (MISCELLANEOUS) ×3
PACK LAMINECTOMY NEURO (CUSTOM PROCEDURE TRAY) ×3 IMPLANT
PLATE DECADE XLIF 2H SZ14 (Plate) ×2 IMPLANT
PUTTY BONE ATTRAX 5CC STRIP (Putty) ×4 IMPLANT
SPONGE LAP 4X18 RFD (DISPOSABLE) IMPLANT
STAPLER SKIN PROX WIDE 3.9 (STAPLE) ×3 IMPLANT
SUT VIC AB 1 CT1 18XBRD ANBCTR (SUTURE) ×1 IMPLANT
SUT VIC AB 1 CT1 8-18 (SUTURE) ×3
SUT VIC AB 2-0 CT1 18 (SUTURE) ×3 IMPLANT
SUT VIC AB 3-0 SH 8-18 (SUTURE) ×3 IMPLANT
TOWEL GREEN STERILE (TOWEL DISPOSABLE) ×2 IMPLANT
TOWEL GREEN STERILE FF (TOWEL DISPOSABLE) ×2 IMPLANT
TRAY FOLEY MTR SLVR 16FR STAT (SET/KITS/TRAYS/PACK) ×3 IMPLANT
WATER STERILE IRR 1000ML POUR (IV SOLUTION) ×3 IMPLANT

## 2019-08-16 NOTE — Evaluation (Signed)
Physical Therapy Evaluation Patient Details Name: Andrew [name] MRN: [id number] DOB: Jun 04, 1956 Today's Date: 08/16/2019   History of Present Illness  Patient is a 63 y/o male admitted with SS L2-3 with spondylolisthesis and HNP with radiculopathy now s/p ALIF L2-3 on the right.  Clinical Impression  Patient presents with decreased mobility due to pain and imbalance and will benefit from skilled PT in the acute setting to maximize mobility and safety prior to d/c home with wife assist.  Will assess next session if pt needs a cane or walker prior to d/c and will practice steps for safe home entry.     Follow Up Recommendations No PT follow up    Equipment Recommendations  Other (comment)(TBA if cane vs RW needed)    Recommendations for Other Services       Precautions / Restrictions Precautions Precautions: Back Precaution Booklet Issued: Yes (comment) Required Braces or Orthoses: Spinal Brace Spinal Brace: Lumbar corset;Applied in sitting position      Mobility  Bed Mobility Overal bed mobility: Modified Independent             General bed mobility comments: proper technique for spinal precautions with sit to sidelying  Transfers Overall transfer level: Needs assistance Equipment used: None Transfers: Sit to/from Stand Sit to Stand: Min guard;From elevated surface         General transfer comment: preferred not to use device  Ambulation/Gait Ambulation/Gait assistance: Min guard;Supervision Gait Distance (Feet): 200 Feet Assistive device: None Gait Pattern/deviations: Step-to pattern;Step-through pattern;Decreased stance time - right;Wide base of support     General Gait Details: mild antalgia on R, slow and guarded with mild imbalance but no overt LOB  Stairs            Wheelchair Mobility    Modified Rankin (Stroke Patients Only)       Balance Overall balance assessment: Needs assistance   Sitting balance-Leahy Scale: Good        Standing balance-Leahy Scale: Good Standing balance comment: can walk without device, but guarded, slow and mild unsteadiness                             Pertinent Vitals/Pain Pain Assessment: 0-10 Pain Score: 8  Pain Location: surgical site and R hip Pain Descriptors / Indicators: Grimacing;Guarding Pain Intervention(s): Monitored during session;Repositioned    Home Living Family/patient expects to be discharged to:: Private residence Living Arrangements: Spouse/significant other Available Help at Discharge: Family Type of Home: House Home Access: Stairs to enter Entrance Stairs-Rails: None Entrance Stairs-Number of Steps: 2 Home Layout: Two level;Able to live on main level with bedroom/bathroom;Full bath on main level Home Equipment: None;Grab bars - tub/shower      Prior Function Level of Independence: Independent               Hand Dominance        Extremity/Trunk Assessment   Upper Extremity Assessment Upper Extremity Assessment: Overall WFL for tasks assessed    Lower Extremity Assessment Lower Extremity Assessment: LLE deficits/detail LLE Deficits / Details: history of numbness in foot longstanding LLE Sensation: decreased light touch    Cervical / Trunk Assessment Cervical / Trunk Assessment: Other exceptions Cervical / Trunk Exceptions: s/p spinal surgery  Communication   Communication: No difficulties  Cognition Arousal/Alertness: Awake/alert Behavior During Therapy: WFL for tasks assessed/performed Overall Cognitive Status: Within Functional Limits for tasks assessed  General Comments General comments (skin integrity, edema, etc.): wife in room pt able to don brace sitting with S, issued handout with precautions, RN in room to deliver nausea medication as nauseated initially upon standing    Exercises     Assessment/Plan    PT Assessment Patient needs continued PT services   PT Problem List Decreased balance;Decreased knowledge of use of DME;Decreased knowledge of precautions;Decreased mobility;Pain       PT Treatment Interventions DME instruction;Stair training;Therapeutic activities;Gait training;Functional mobility training;Therapeutic exercise;Patient/family education    PT Goals (Current goals can be found in the Care Plan section)  Acute Rehab PT Goals Patient Stated Goal: to walk with less pain PT Goal Formulation: With patient/family Time For Goal Achievement: 08/23/19 Potential to Achieve Goals: Good    Frequency Min 5X/week   Barriers to discharge        Co-evaluation               AM-PAC PT "6 Clicks" Mobility  Outcome Measure Help needed turning from your back to your side while in a flat bed without using bedrails?: None Help needed moving from lying on your back to sitting on the side of a flat bed without using bedrails?: None Help needed moving to and from a bed to a chair (including a wheelchair)?: A Little Help needed standing up from a chair using your arms (e.g., wheelchair or bedside chair)?: A Little Help needed to walk in hospital room?: A Little Help needed climbing 3-5 steps with a railing? : A Little 6 Click Score: 20    End of Session Equipment Utilized During Treatment: Back brace Activity Tolerance: Patient limited by pain Patient left: in bed;with call bell/phone within reach;with family/visitor present   PT Visit Diagnosis: Other abnormalities of gait and mobility (R26.89)    Time: [id number] PT Time Calculation (min) (ACUTE ONLY): 25 min   Charges:   PT Evaluation $PT Eval Low Complexity: 1 Low PT Treatments $Gait Training: 8-22 mins        Magda Kiel, [place] [phone] 08/16/2019   [name] 08/16/2019, 5:34 PM

## 2019-08-16 NOTE — Transfer of Care (Signed)
Immediate Anesthesia Transfer of Care Note  Patient: Andrew [name]  Procedure(s) Performed: ANTERIOR LATERAL LUMBAR FUSION  RIGHT LUMBAR TWO- LUMBAR THREE WITH LATERAL PLATE (Right Spine Lumbar)  Patient Location: PACU  Anesthesia Type:General  Level of Consciousness: drowsy and patient cooperative  Airway & Oxygen Therapy: Patient Spontanous Breathing and Patient connected to nasal cannula oxygen  Post-op Assessment: Report given to RN, Post -op Vital signs reviewed and stable and Patient moving all extremities X 4  Post vital signs: Reviewed and stable  Last Vitals:  Vitals Value Taken Time  BP 149/93 08/16/19 1415  Temp 36.1 C 08/16/19 1415  Pulse 79 08/16/19 1417  Resp 11 08/16/19 1417  SpO2 99 % 08/16/19 1417  Vitals shown include unvalidated device data.  Last Pain:  Vitals:   08/16/19 0933  PainSc: 7       Patients Stated Pain Goal: 3 ([id number])  Complications: No apparent anesthesia complications

## 2019-08-16 NOTE — Progress Notes (Signed)
Awake, alert, conversant.  MAEW with good power.  Doing well.  

## 2019-08-16 NOTE — Anesthesia Procedure Notes (Signed)
Procedure Name: Intubation Date/Time: 08/16/2019 11:56 AM Performed by: Larene Beach, CRNA Pre-anesthesia Checklist: Patient identified, Emergency Drugs available, Suction available and Patient being monitored Patient Re-evaluated:Patient Re-evaluated prior to induction Oxygen Delivery Method: Circle system utilized Preoxygenation: Pre-oxygenation with 100% oxygen Induction Type: IV induction Ventilation: Mask ventilation without difficulty Laryngoscope Size: Mac and 4 Grade View: Grade I Tube type: Oral Tube size: 7.5 mm Number of attempts: 1 Airway Equipment and Method: Stylet and Oral airway Placement Confirmation: ETT inserted through vocal cords under direct vision,  positive ETCO2 and breath sounds checked- equal and bilateral Secured at: 23 cm Tube secured with: Tape Dental Injury: Teeth and Oropharynx as per pre-operative assessment

## 2019-08-16 NOTE — Op Note (Signed)
08/16/2019  2:07 PM  PATIENT:  Andrew [name]  63 y.o. male  PRE-OPERATIVE DIAGNOSIS:  DEGENERATIVE LUMBAR SPINAL STENOSIS, spondylolisthesis, herniated lumbar disc, lumbago, radiculopathy L 23 level  POST-OPERATIVE DIAGNOSIS:  DEGENERATIVE LUMBAR SPINAL STENOSIS, Spondylolisthesis,  herniated lumbar disc, lumbago, radiculopathy L 23 level   PROCEDURE:  Procedure(s) with comments: ANTERIOR LATERAL LUMBAR FUSION  RIGHT LUMBAR TWO- LUMBAR THREE WITH LATERAL PLATE (Right) - ANTERIOR LATERAL LUMBAR FUSION  RIGHT LUMBAR 2- LUMBAR 3 WITH LATERAL PLATE   SURGEON:  Surgeon(s) and Role:    * Erline Levine, MD - Primary    * Ostergard, Joyice Faster, MD - Assisting  PHYSICIAN ASSISTANT:   ASSISTANTS: Poteat, RN   ANESTHESIA:   general  EBL:  20 mL   BLOOD ADMINISTERED:none  DRAINS: none   LOCAL MEDICATIONS USED:  MARCAINE    and LIDOCAINE   SPECIMEN:  No Specimen  DISPOSITION OF SPECIMEN:  N/A  COUNTS:  YES  TOURNIQUET:  * No tourniquets in log *  DICTATION: Patient is a 63 year old with spondylosis and  stenosis 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 It was elected to take him to surgery for anterolateral decompression and lateral plate fixation at the L 23 level.  He had previously undergone fusion L 3 - S 1 levels.  Procedure: Patient was brought to the operating room and placed in a left lateral decubitus position on the operative table and using orthogonally projected C-arm fluoroscopy the patient was placed so that the L 23  level was visualized in AP and lateral plane. The patient was then taped into position. The table was flexed so as to expose the L 23 level. Skin was marked along with a posterior finger dissection incision. His flank was then prepped and draped in usual sterile fashion and incisions were made overlying the 11 th rib. Posterior finger dissection was made to enter the retroperitoneal space and then subsequently the probe was inserted into the  psoas muscle from the right side initially at the L 23 level. After mapping the neural elements were able to dock the probe per the midpoint of this vertebral level and without indications electrically of too close proximity to the neural tissues. Subsequently the self-retaining tractor was.after sequential dilators were utilized the shim was employed and the interspace was cleared of psoas muscle and then incised. A thorough discectomy was performed. Instruments were used to clear the interspace of disc material. After thorough discectomy was performed and this was performed using AP and lateral fluoroscopy a 14 lordotic by 55 x 22 mm PEEK implant was packed with extra extra small BMP and Attrax. This was tamped into position using the slides and its position was confirmed on AP and lateral fluoroscopy. A Decade lateral plate (size 14) was affixed to the lateral spine with one 5.0 x 55 mm screw at each vertebral level. All screws were torque locked and positioning was confirmed with AP and lateral fluoroscopy. . Hemostasis was assured the wounds were irrigated interrupted Vicryl sutures.Sterile occlusive dressing was placed with Dermabond and an occlusive dressing. The patient was then extubated in the operating room and taken to recovery in stable and satisfactory condition having tolerated his operation well. Counts were correct at the end of the case.  PLAN OF CARE: Admit to inpatient   PATIENT DISPOSITION:  PACU - hemodynamically stable.   Delay start of Pharmacological VTE agent (>24hrs) due to surgical blood loss or risk of bleeding: yes

## 2019-08-16 NOTE — Brief Op Note (Signed)
08/16/2019  2:07 PM  PATIENT:  Andrew [name]  63 y.o. male  PRE-OPERATIVE DIAGNOSIS:  DEGENERATIVE LUMBAR SPINAL STENOSIS, spondylolisthesis, herniated lumbar disc, lumbago, radiculopathy L 23 level  POST-OPERATIVE DIAGNOSIS:  DEGENERATIVE LUMBAR SPINAL STENOSIS, Spondylolisthesis,  herniated lumbar disc, lumbago, radiculopathy L 23 level   PROCEDURE:  Procedure(s) with comments: ANTERIOR LATERAL LUMBAR FUSION  RIGHT LUMBAR TWO- LUMBAR THREE WITH LATERAL PLATE (Right) - ANTERIOR LATERAL LUMBAR FUSION  RIGHT LUMBAR 2- LUMBAR 3 WITH LATERAL PLATE   SURGEON:  Surgeon(s) and Role:    * Erline Levine, MD - Primary    * Ostergard, Joyice Faster, MD - Assisting  PHYSICIAN ASSISTANT:   ASSISTANTS: Poteat, RN   ANESTHESIA:   general  EBL:  20 mL   BLOOD ADMINISTERED:none  DRAINS: none   LOCAL MEDICATIONS USED:  MARCAINE    and LIDOCAINE   SPECIMEN:  No Specimen  DISPOSITION OF SPECIMEN:  N/A  COUNTS:  YES  TOURNIQUET:  * No tourniquets in log *  DICTATION: Patient is a 63 year old with spondylosis and  stenosis 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 It was elected to take him to surgery for anterolateral decompression and lateral plate fixation at the L 23 level.  He had previously undergone fusion L 3 - S 1 levels.  Procedure: Patient was brought to the operating room and placed in a left lateral decubitus position on the operative table and using orthogonally projected C-arm fluoroscopy the patient was placed so that the L 23  level was visualized in AP and lateral plane. The patient was then taped into position. The table was flexed so as to expose the L 23 level. Skin was marked along with a posterior finger dissection incision. His flank was then prepped and draped in usual sterile fashion and incisions were made overlying the 11 th rib. Posterior finger dissection was made to enter the retroperitoneal space and then subsequently the probe was inserted into the  psoas muscle from the right side initially at the L 23 level. After mapping the neural elements were able to dock the probe per the midpoint of this vertebral level and without indications electrically of too close proximity to the neural tissues. Subsequently the self-retaining tractor was.after sequential dilators were utilized the shim was employed and the interspace was cleared of psoas muscle and then incised. A thorough discectomy was performed. Instruments were used to clear the interspace of disc material. After thorough discectomy was performed and this was performed using AP and lateral fluoroscopy a 14 lordotic by 55 x 22 mm PEEK implant was packed with extra extra small BMP and Attrax. This was tamped into position using the slides and its position was confirmed on AP and lateral fluoroscopy. A Decade lateral plate (size 14) was affixed to the lateral spine with one 5.0 x 55 mm screw at each vertebral level. All screws were torque locked and positioning was confirmed with AP and lateral fluoroscopy. . Hemostasis was assured the wounds were irrigated interrupted Vicryl sutures.Sterile occlusive dressing was placed with Dermabond and an occlusive dressing. The patient was then extubated in the operating room and taken to recovery in stable and satisfactory condition having tolerated his operation well. Counts were correct at the end of the case.  PLAN OF CARE: Admit to inpatient   PATIENT DISPOSITION:  PACU - hemodynamically stable.   Delay start of Pharmacological VTE agent (>24hrs) due to surgical blood loss or risk of bleeding: yes

## 2019-08-16 NOTE — Anesthesia Postprocedure Evaluation (Signed)
Anesthesia Post Note  Patient: Andrew [name]  Procedure(s) Performed: ANTERIOR LATERAL LUMBAR FUSION  RIGHT LUMBAR TWO- LUMBAR THREE WITH LATERAL PLATE (Right Spine Lumbar)     Patient location during evaluation: PACU Anesthesia Type: General Level of consciousness: awake and alert Pain management: pain level controlled Vital Signs Assessment: post-procedure vital signs reviewed and stable Respiratory status: spontaneous breathing, nonlabored ventilation, respiratory function stable and patient connected to nasal cannula oxygen Cardiovascular status: blood pressure returned to baseline and stable Postop Assessment: no apparent nausea or vomiting Anesthetic complications: no    Last Vitals:  Vitals:   08/16/19 1456 08/16/19 1515  BP: (!) 151/95 (!) 157/80  Pulse: 67 68  Resp: 13 16  Temp: (!) 36.2 C 36.5 C  SpO2: 97% 98%    Last Pain:  Vitals:   08/16/19 1515  TempSrc: Oral  PainSc:                  Ryan P Ellender

## 2019-08-16 NOTE — Anesthesia Preprocedure Evaluation (Addendum)
Anesthesia Evaluation  Patient identified by MRN, date of birth, ID band Patient awake    Reviewed: Allergy & Precautions, NPO status , Patient's Chart, lab work & pertinent test results  History of Anesthesia Complications (+) PONV  Airway Mallampati: II  TM Distance: >3 FB Neck ROM: Full    Dental no notable dental hx.    Pulmonary former smoker,    Pulmonary exam normal breath sounds clear to auscultation       Cardiovascular negative cardio ROS Normal cardiovascular exam Rhythm:Regular Rate:Normal     Neuro/Psych PSYCHIATRIC DISORDERS Anxiety Depression negative neurological ROS     GI/Hepatic negative GI ROS, Neg liver ROS,   Endo/Other  negative endocrine ROS  Renal/GU negative Renal ROS     Musculoskeletal  (+) Arthritis , Back pain    Abdominal   Peds  Hematology HLD   Anesthesia Other Findings DEGENERATIVE LUMBAR SPINAL STENOSIS  Reproductive/Obstetrics                            Anesthesia Physical Anesthesia Plan  ASA: II  Anesthesia Plan: General   Post-op Pain Management:    Induction: Intravenous  PONV Risk Score and Plan: 4 or greater and Ondansetron, Dexamethasone, Midazolam and Treatment may vary due to age or medical condition  Airway Management Planned: Oral ETT  Additional Equipment:   Intra-op Plan:   Post-operative Plan: Extubation in OR  Informed Consent: I have reviewed the patients History and Physical, chart, labs and discussed the procedure including the risks, benefits and alternatives for the proposed anesthesia with the patient or authorized representative who has indicated his/her understanding and acceptance.     Dental advisory given  Plan Discussed with: CRNA  Anesthesia Plan Comments:        Anesthesia Quick Evaluation

## 2019-08-16 NOTE — Interval H&P Note (Signed)
History and Physical Interval Note:  08/16/2019 11:15 AM  Andrew [name]  has presented today for surgery, with the diagnosis of DEGENERATIVE LUMBAR SPINAL STENOSIS.  The various methods of treatment have been discussed with the patient and family. After consideration of risks, benefits and other options for treatment, the patient has consented to  Procedure(s) with comments: ANTERIOR LATERAL LUMBAR FUSION  RIGHT LUMBAR 2- LUMBAR 3 WITH LATERAL PLATE (Right) - ANTERIOR LATERAL LUMBAR FUSION  RIGHT LUMBAR 2- LUMBAR 3 WITH LATERAL PLATE as a surgical intervention.  The patient's history has been reviewed, patient examined, no change in status, stable for surgery.  I have reviewed the patient's chart and labs.  Questions were answered to the patient's satisfaction.     Peggyann Shoals

## 2019-08-17 ENCOUNTER — Encounter (HOSPITAL_COMMUNITY): Payer: Self-pay | Admitting: Neurosurgery

## 2019-08-17 MED ORDER — HYDROMORPHONE HCL 2 MG PO TABS: 2.0000 mg | ORAL_TABLET | Freq: Four times a day (QID) | ORAL | 0 refills | Status: AC | PRN

## 2019-08-17 MED ORDER — PANTOPRAZOLE SODIUM 40 MG PO TBEC: 40.0000 mg | DELAYED_RELEASE_TABLET | Freq: Every day | ORAL | Status: DC

## 2019-08-17 MED ORDER — METHOCARBAMOL 500 MG PO TABS: 500.0000 mg | ORAL_TABLET | Freq: Four times a day (QID) | ORAL | 1 refills | Status: DC | PRN

## 2019-08-17 MED ORDER — HYDROMORPHONE HCL 2 MG PO TABS: 2.0000 mg | ORAL_TABLET | Freq: Four times a day (QID) | ORAL | 0 refills | Status: DC | PRN

## 2019-08-17 NOTE — Progress Notes (Addendum)
Subjective: Patient reports "I feel pretty good now.. was pretty painful last night"  Objective: Vital signs in last 24 hours: Temp:  [97 F (36.1 C)-98.6 F (37 C)] 98.5 F (36.9 C) (10/28 0737) Pulse Rate:  [67-102] 102 (10/28 0737) Resp:  [12-20] 18 (10/28 0737) BP: (133-164)/(76-96) 140/89 (10/28 0737) SpO2:  [93 %-100 %] 97 % (10/28 0737) Weight:  [100.5 kg] 100.5 kg (10/27 0933)  Intake/Output from previous day: 10/27 0701 - 10/28 0700 In: 1500 [P.O.:50; I.V.:1200; IV Piggyback:200] Out: 370 [Urine:350; Blood:20] Intake/Output this shift: No intake/output data recorded.  Alert, conversant, reporting decreased leg pain from pre-op and only mild lumbar pain at present. He reported significant back pain overnight. Incisions without erythema, swelling or drainage beneath honeycomb and Dermabond. Good strength BLE. He reports burning on urination, no visible blood, and no other symptoms.   Lab Results: No results for input(s): WBC, HGB, HCT, PLT in the last 72 hours. BMET No results for input(s): NA, K, CL, CO2, GLUCOSE, BUN, CREATININE, CALCIUM in the last 72 hours.  Studies/Results: Dg Lumbar Spine 2-3 Views  Result Date: 08/16/2019 CLINICAL DATA:  L2-3 fusion EXAM: LUMBAR SPINE - 2-3 VIEW; DG C-ARM 1-60 MIN COMPARISON:  CT 06/29/2019 FINDINGS: Demonstration of a right lateral side plate and screw fixation across the L2-3 vertebral levels. Redemonstration of the superior extent of the L3-S1 posterior spinal fusion hardware seen on comparison imaging. No immediate postoperative hardware complication is seen. IMPRESSION: Demonstration of a right lateral side plate and screw fixation across the L2-3 vertebral levels. No acute complication. Electronically Signed   By: Lovena Le M.D.   On: 08/16/2019 15:38   Dg C-arm 1-60 Min  Result Date: 08/16/2019 CLINICAL DATA:  L2-3 fusion EXAM: LUMBAR SPINE - 2-3 VIEW; DG C-ARM 1-60 MIN COMPARISON:  CT 06/29/2019 FINDINGS: Demonstration  of a right lateral side plate and screw fixation across the L2-3 vertebral levels. Redemonstration of the superior extent of the L3-S1 posterior spinal fusion hardware seen on comparison imaging. No immediate postoperative hardware complication is seen. IMPRESSION: Demonstration of a right lateral side plate and screw fixation across the L2-3 vertebral levels. No acute complication. Electronically Signed   By: Lovena Le M.D.   On: 08/16/2019 15:38    Assessment/Plan: improved  LOS: 1 day   Per Dr. Vertell Limber, d/c to home. Pt verbalizes understanding of d/c instructions and states he already has f/u appt in place. Rx's for Dilaudid 2mg  and Robaxin 500mg  will be eRx'e for prn home use. Pt will call if burning on urination does not resolve in next day or two, and also with report of fevers, hematuria, or pain.   Andrew [name] 08/17/2019, 8:14 AM   Doing well.  [place] [facility].

## 2019-08-17 NOTE — Discharge Summary (Signed)
Physician Discharge Summary  Patient ID: Andrew [name] MRN: [id number] DOB/AGE: 21-May-1956 63 y.o.  Admit date: 08/16/2019 Discharge date: 08/17/2019  Admission Diagnoses: DEGENERATIVE LUMBAR SPINAL STENOSIS, spondylolisthesis, herniated lumbar disc, lumbago, radiculopathy L 23 level    Discharge Diagnoses: DEGENERATIVE LUMBAR SPINAL STENOSIS, spondylolisthesis, herniated lumbar disc, lumbago, radiculopathy L 23 level s/p ANTERIOR LATERAL LUMBAR FUSION RIGHT LUMBAR TWO- LUMBAR THREE WITH LATERAL PLATE (Right) - ANTERIOR LATERAL LUMBAR FUSION RIGHT LUMBAR 2- LUMBAR 3 WITH LATERAL PLATE      Active Problems:   Lumbar foraminal stenosis   Discharged Condition: good  Hospital Course: Andrew [name] was admitted with dx stenosis and radiculopathy. Following uncomplicated XLIF and lateral plate L2-3, he recovered nicely and transferred to [facility] for nursing care and therapies. He is mobilizing well.  Consults: None  Significant Diagnostic Studies: intra-op x-ray  Treatments: surgery: ANTERIOR LATERAL LUMBAR FUSION RIGHT LUMBAR TWO- LUMBAR THREE WITH LATERAL PLATE (Right) - ANTERIOR LATERAL LUMBAR FUSION RIGHT LUMBAR 2- LUMBAR 3 WITH LATERAL PLATE     Discharge Exam: Blood pressure 140/89, pulse (!) 102, temperature 98.5 F (36.9 C), temperature source Oral, resp. rate 18, height 6\' 5"  (1.956 m), weight 100.5 kg, SpO2 97 %. Alert, conversant, reporting decreased leg pain from pre-op and only mild lumbar pain at present. He reported significant back pain overnight. Incisions without erythema, swelling or drainage beneath honeycomb and Dermabond. Good strength BLE. He reports burning on urination, no visible blood, and no other symptoms.    Disposition: Discharge to home.  Pt verbalizes understanding of d/c instructions and states he already has f/u appt in place. Rx's for Dilaudid 2mg  and Robaxin 500mg  will be eRx'e for prn home use. Pt will call if burning on  urination does not resolve in next day or two, and also with report of fevers, hematuria, or pain.     Allergies as of 08/17/2019      Reactions   Hydromorphone    No allergy per patient   Lyrica [pregabalin]    No relief of pain   Neurontin [gabapentin]    No relief of pain   Atorvastatin Other (See Comments)   myalgias   Celebrex [celecoxib] Other (See Comments)   GI upset but may tolerate lower dose.     Escitalopram Oxalate Anxiety   SLEEPLESSNESS   Sertraline Hcl Anxiety      Medication List    STOP taking these medications   celecoxib 100 MG capsule Commonly known as: CeleBREX     TAKE these medications   acetaminophen 650 MG CR tablet Commonly known as: TYLENOL Take 1,300 mg by mouth every 8 (eight) hours as needed for pain.   calcium carbonate 500 MG chewable tablet Commonly known as: TUMS - dosed in mg elemental calcium Chew 1 tablet by mouth daily as needed for indigestion or heartburn.   docusate sodium 50 MG capsule Commonly known as: COLACE Take 50 mg by mouth 2 (two) times daily as needed for mild constipation.   Fish Oil 1000 MG Caps Take 1,000 mg by mouth daily.   HYDROmorphone 2 MG tablet Commonly known as: Dilaudid Take 1 tablet (2 mg total) by mouth every 6 (six) hours as needed for up to 15 days for severe pain.   methocarbamol 500 MG tablet Commonly known as: ROBAXIN Take 1 tablet (500 mg total) by mouth every 6 (six) hours as needed for muscle spasms.   pravastatin 20 MG tablet Commonly known as: PRAVACHOL Take 1 tablet (20 mg total) by  mouth every other day.   sildenafil 20 MG tablet Commonly known as: REVATIO Take 3-5 tablets (60-100 mg total) by mouth daily as needed. What changed: reasons to take this   tiZANidine 4 MG tablet Commonly known as: Zanaflex Take 0.5-1 tablets (2-4 mg total) by mouth every 6 (six) hours as needed for muscle spasms.   traMADol 50 MG tablet Commonly known as: ULTRAM Take 1 tablet (50 mg total)  by mouth every 8 (eight) hours as needed. What changed: reasons to take this        Signed: Peggyann Shoals, MD 08/17/2019, 8:38 AM

## 2019-08-17 NOTE — Discharge Instructions (Signed)

## 2019-08-17 NOTE — Plan of Care (Signed)
Pt doing well. Pt given D/C instructions with verbal understanding. Rx's were sent to pharmacy by MD. Pt's incision is clean and dry with no sign of infection. Pt's IV was removed prior to D/C. Pt D/C'd home via wheelchair per MD order. Pt is stable @ D/C and has no other needs at this time. Imani Sherrin, RN  

## 2019-08-17 NOTE — Progress Notes (Signed)
Occupational Therapy Evaluation Patient Details Name: Andrew [name] MRN: [id number] DOB: 1956-05-19 Today's Date: 08/17/2019    History of Present Illness Patient is a 63 y/o male admitted with SS L2-3 with spondylolisthesis and HNP with radiculopathy now s/p ALIF L2-3 on the right.   Clinical Impression   PTA, pt lived in two story home with wife, and was independent for all ADLs and IADLs. Pt currently presents with decreased balance, strength, activity tolerance, knowledge of precautions, and increased pain impacting safe performance of ADLs. Provided education regarding brace management and compensatory techniques for ADLs. Pt performed dressing, brace management, and functional transfers with supervision for safety. Recommend dc home with no OT follow up once medically stable per MD. All education and acute needs met, will sign off.     Follow Up Recommendations  No OT follow up;Supervision/Assistance - 24 hour    Equipment Recommendations  None recommended by OT    Recommendations for Other Services PT consult     Precautions / Restrictions Precautions Precautions: Back Precaution Booklet Issued: Yes (comment) Precaution Comments: pt recalled 3/3 precautions. Required min VCs throughout session to maintain precautions during ADLs Required Braces or Orthoses: Spinal Brace Spinal Brace: Lumbar corset;Applied in sitting position Restrictions Weight Bearing Restrictions: No      Mobility Bed Mobility               General bed mobility comments: pt sitting EOB upon arrival  Transfers Overall transfer level: Needs assistance Equipment used: None Transfers: Sit to/from Stand Sit to Stand: Supervision         General transfer comment: Pt required supervision for safety and balance    Balance Overall balance assessment: Mild deficits observed, not formally tested                                      ADL either performed or assessed with  clinical judgement   ADL Overall ADL's : Needs assistance/impaired Eating/Feeding: Independent;Sitting   Grooming: Standing;Supervision/safety   Upper Body Bathing: Supervision/ safety;Sitting   Lower Body Bathing: Supervison/ safety;Sit to/from stand   Upper Body Dressing : Supervision/safety;Sitting Upper Body Dressing Details (indicate cue type and reason): Provided education regarding brace management. donned shirt and brace with supervision for safety Lower Body Dressing: Supervision/safety;Sit to/from stand Lower Body Dressing Details (indicate cue type and reason): Provided education regarding figure 4 method for LB ADLs. Pt donned LB clothing with supervision and VCs to maintain back precautions Toilet Transfer: Supervision/safety;Ambulation(simulated to recliner)   Toileting- Clothing Manipulation and Hygiene: Supervision/safety;Sit to/from stand       Functional mobility during ADLs: Supervision/safety General ADL Comments: Provided education regarding compensatory techniques for completing ADLs. Pt required superivison for most ADLs with VCs to maintain back precautions     Vision         Perception     Praxis      Pertinent Vitals/Pain Pain Assessment: Faces Pain Score: 5  Faces Pain Scale: Hurts a little bit Pain Location: surgical site and R hip Pain Descriptors / Indicators: Grimacing;Guarding Pain Intervention(s): Limited activity within patient's tolerance;Monitored during session;Repositioned     Hand Dominance     Extremity/Trunk Assessment Upper Extremity Assessment Upper Extremity Assessment: Overall WFL for tasks assessed   Lower Extremity Assessment Lower Extremity Assessment: Defer to PT evaluation LLE Deficits / Details: history of numbness in foot longstanding LLE Sensation: decreased light touch   Cervical /  Trunk Assessment Cervical / Trunk Assessment: Other exceptions Cervical / Trunk Exceptions: s/p spinal surgery   Communication  Communication Communication: No difficulties   Cognition Arousal/Alertness: Awake/alert Behavior During Therapy: WFL for tasks assessed/performed Overall Cognitive Status: Within Functional Limits for tasks assessed                                     General Comments  per nurse tech, all VSS    Exercises     Shoulder Instructions      Home Living Family/patient expects to be discharged to:: Private residence Living Arrangements: Spouse/significant other Available Help at Discharge: Family Type of Home: House Home Access: Stairs to enter Technical brewer of Steps: 2 Entrance Stairs-Rails: None Home Layout: Two level;Able to live on main level with bedroom/bathroom;Full bath on main level     Bathroom Shower/Tub: Occupational psychologist: Handicapped height     Home Equipment: Bedside commode;Hand held shower head;Grab bars - tub/shower          Prior Functioning/Environment Level of Independence: Independent        Comments: Pt independent for ADLs and IADLs        OT Problem List: Decreased strength;Decreased range of motion;Decreased activity tolerance;Impaired balance (sitting and/or standing);Decreased safety awareness;Decreased knowledge of use of DME or AE;Decreased knowledge of precautions;Pain      OT Treatment/Interventions:      OT Goals(Current goals can be found in the care plan section) Acute Rehab OT Goals Patient Stated Goal: to walk with less pain OT Goal Formulation: All assessment and education complete, DC therapy  OT Frequency:     Barriers to D/C:            Co-evaluation              AM-PAC OT "6 Clicks" Daily Activity     Outcome Measure Help from another person eating meals?: None Help from another person taking care of personal grooming?: None Help from another person toileting, which includes using toliet, bedpan, or urinal?: None Help from another person bathing (including washing,  rinsing, drying)?: None Help from another person to put on and taking off regular upper body clothing?: None Help from another person to put on and taking off regular lower body clothing?: None 6 Click Score: 24   End of Session Equipment Utilized During Treatment: Back brace Nurse Communication: Mobility status  Activity Tolerance: Patient tolerated treatment well Patient left: in chair;with call bell/phone within reach  OT Visit Diagnosis: Muscle weakness (generalized) (M62.81);Pain Pain - part of body: (back)                Time: 5501-5868 OT Time Calculation (min): 12 min Charges:  OT General Charges $OT Visit: 1 Visit OT Evaluation $OT Eval Low Complexity: [place], OT Student  Gus Rankin 08/17/2019, 9:27 AM

## 2019-08-17 NOTE — Progress Notes (Signed)
Physical Therapy Treatment Patient Details Name: Andrew [name] MRN: [id number] DOB: Nov 19, 1955 Today's Date: 08/17/2019    History of Present Illness Patient is a 63 y/o male admitted with SS L2-3 with spondylolisthesis and HNP with radiculopathy now s/p ALIF L2-3 on the right.    PT Comments    Pt progressing well with post-op mobility. He was able to demonstrate transfers and ambulation with gross modified independence and no AD. Education reinforced on precautions, brace application/wearing schedule, appropriate activity progression, and car transfer. Will continue to follow.     Follow Up Recommendations  No PT follow up     Equipment Recommendations  None recommended by PT    Recommendations for Other Services       Precautions / Restrictions Precautions Precautions: Back Precaution Booklet Issued: Yes (comment) Precaution Comments: pt recalled 3/3 precautions. Required min VCs throughout session to maintain precautions during ADLs Required Braces or Orthoses: Spinal Brace Spinal Brace: Lumbar corset;Applied in sitting position Restrictions Weight Bearing Restrictions: No    Mobility  Bed Mobility Overal bed mobility: Modified Independent             General bed mobility comments: Pt sitting up in recliner chair upon PT arrival.   Transfers Overall transfer level: Modified independent Equipment used: None Transfers: Sit to/from Stand           General transfer comment: Pt demonstrated proper hand placement on seated surface for safety.   Ambulation/Gait Ambulation/Gait assistance: Modified independent (Device/Increase time) Gait Distance (Feet): 400 Feet Assistive device: None Gait Pattern/deviations: Step-through pattern;Decreased stance time - right Gait velocity: Decreased Gait velocity interpretation: <1.8 ft/sec, indicate of risk for recurrent falls General Gait Details: Good posture and overall steady gait pattern with decreased gait speed.     Stairs Stairs: Yes Stairs assistance: Supervision Stair Management: One rail Right;Step to pattern;Forwards Number of Stairs: 10 General stair comments: No assist required. VC's for sequencing and general safety.    Wheelchair Mobility    Modified Rankin (Stroke Patients Only)       Balance Overall balance assessment: Mild deficits observed, not formally tested                                          Cognition Arousal/Alertness: Awake/alert Behavior During Therapy: WFL for tasks assessed/performed Overall Cognitive Status: Within Functional Limits for tasks assessed                                        Exercises      General Comments        Pertinent Vitals/Pain Pain Assessment: Faces Faces Pain Scale: Hurts a little bit Pain Location: surgical site and R hip Pain Descriptors / Indicators: Grimacing;Guarding    Home Living                      Prior Function            PT Goals (current goals can now be found in the care plan section) Acute Rehab PT Goals Patient Stated Goal: to walk with less pain PT Goal Formulation: With patient Time For Goal Achievement: 08/23/19 Potential to Achieve Goals: Good Progress towards PT goals: Progressing toward goals    Frequency    Min 5X/week  PT Plan Current plan remains appropriate    Co-evaluation              AM-PAC PT "6 Clicks" Mobility   Outcome Measure  Help needed turning from your back to your side while in a flat bed without using bedrails?: None Help needed moving from lying on your back to sitting on the side of a flat bed without using bedrails?: None Help needed moving to and from a bed to a chair (including a wheelchair)?: A Little Help needed standing up from a chair using your arms (e.g., wheelchair or bedside chair)?: A Little Help needed to walk in hospital room?: A Little Help needed climbing 3-5 steps with a railing? : A  Little 6 Click Score: 20    End of Session Equipment Utilized During Treatment: Back brace Activity Tolerance: Patient tolerated treatment well Patient left: in chair;with call bell/phone within reach Nurse Communication: Mobility status PT Visit Diagnosis: Other abnormalities of gait and mobility (R26.89)     Time: [id number] PT Time Calculation (min) (ACUTE ONLY): 14 min  Charges:  $Gait Training: 8-22 mins                     [name], PT, DPT Acute Rehabilitation Services Pager: [phone] Office: [phone]    Thelma Comp 08/17/2019, 3:25 PM

## 2019-08-18 MED FILL — Heparin Sodium (Porcine) Inj 1000 Unit/ML: INTRAMUSCULAR | Qty: 30 | Status: AC

## 2019-08-18 MED FILL — Sodium Chloride IV Soln 0.9%: INTRAVENOUS | Qty: 1000 | Status: AC

## 2019-12-22 ENCOUNTER — Encounter: Payer: Self-pay | Admitting: Family Medicine

## 2019-12-23 ENCOUNTER — Encounter: Payer: Self-pay | Admitting: Gastroenterology

## 2020-01-03 ENCOUNTER — Ambulatory Visit (AMBULATORY_SURGERY_CENTER): Payer: Self-pay | Admitting: *Deleted

## 2020-01-03 ENCOUNTER — Other Ambulatory Visit: Payer: Self-pay

## 2020-01-03 VITALS — Temp 97.1°F | Ht 76.0 in | Wt 226.0 lb

## 2020-01-03 DIAGNOSIS — Z800 Family history of malignant neoplasm of digestive organs: Secondary | ICD-10-CM

## 2020-01-03 MED ORDER — PEG 3350-KCL-NA BICARB-NACL 420 G PO SOLR: 4000.0000 mL | Freq: Once | ORAL | 0 refills | Status: AC

## 2020-01-03 NOTE — Progress Notes (Signed)
Completed COVID vaccine series 12-22-2019- will not require pre screen covid test   No egg or soy allergy known to patient  No issues with past sedation with any surgeries  or procedures, no intubation problems  No diet pills per patient No home 02 use per patient  No blood thinners per patient  Pt states  issues with constipation - uses Colace but still hard stools occ, some soft every 4-5 days has a BM - 2 day Prep  No A fib or A flutter  EMMI video sent to pt's e mail   Due to the COVID-19 pandemic we are asking patients to follow these guidelines. Please only bring one care partner. Please be aware that your care partner may wait in the car in the parking lot or if they feel like they will be too hot to wait in the car, they may wait in the lobby on the 4th floor. All care partners are required to wear a mask the entire time (we do not have any that we can provide them), they need to practice social distancing, and we will do a Covid check for all patient's and care partners when you arrive. Also we will check their temperature and your temperature. If the care partner waits in their car they need to stay in the parking lot the entire time and we will call them on their cell phone when the patient is ready for discharge so they can bring the car to the front of the building. Also all patient's will need to wear a mask into building.

## 2020-01-23 ENCOUNTER — Encounter: Payer: Self-pay | Admitting: Gastroenterology

## 2020-01-24 ENCOUNTER — Other Ambulatory Visit: Payer: Self-pay

## 2020-01-24 ENCOUNTER — Encounter: Payer: Self-pay | Admitting: Gastroenterology

## 2020-01-24 ENCOUNTER — Ambulatory Visit (AMBULATORY_SURGERY_CENTER): Payer: Medicare Other | Admitting: Gastroenterology

## 2020-01-24 VITALS — BP 134/89 | HR 62 | Temp 98.2°F | Resp 18 | Ht 76.0 in | Wt 226.0 lb

## 2020-01-24 DIAGNOSIS — Z800 Family history of malignant neoplasm of digestive organs: Secondary | ICD-10-CM

## 2020-01-24 DIAGNOSIS — Z1211 Encounter for screening for malignant neoplasm of colon: Secondary | ICD-10-CM

## 2020-01-24 DIAGNOSIS — D122 Benign neoplasm of ascending colon: Secondary | ICD-10-CM

## 2020-01-24 MED ORDER — SODIUM CHLORIDE 0.9 % IV SOLN: 500.0000 mL | Freq: Once | INTRAVENOUS | Status: DC

## 2020-01-24 NOTE — Patient Instructions (Signed)
Discharge instructions given. Handouts on polyps and diverticulosis. Resume previous medications. YOU HAD AN ENDOSCOPIC PROCEDURE TODAY AT THE [facility]:   Refer to the procedure report that was given to you for any specific questions about what was found during the examination.  If the procedure report does not answer your questions, please call your gastroenterologist to clarify.  If you requested that your care partner not be given the details of your procedure findings, then the procedure report has been included in a sealed envelope for you to review at your convenience later.  YOU SHOULD EXPECT: Some feelings of bloating in the abdomen. Passage of more gas than usual.  Walking can help get rid of the air that was put into your GI tract during the procedure and reduce the bloating. If you had a lower endoscopy (such as a colonoscopy or flexible sigmoidoscopy) you may notice spotting of blood in your stool or on the toilet paper. If you underwent a bowel prep for your procedure, you may not have a normal bowel movement for a few days.  Please Note:  You might notice some irritation and congestion in your nose or some drainage.  This is from the oxygen used during your procedure.  There is no need for concern and it should clear up in a day or so.  SYMPTOMS TO REPORT IMMEDIATELY:   Following lower endoscopy (colonoscopy or flexible sigmoidoscopy):  Excessive amounts of blood in the stool  Significant tenderness or worsening of abdominal pains  Swelling of the abdomen that is new, acute  Fever of 100F or higher   For urgent or emergent issues, a gastroenterologist can be reached at any hour by calling [phone]. Do not use MyChart messaging for urgent concerns.    DIET:  We do recommend a small meal at first, but then you may proceed to your regular diet.  Drink plenty of fluids but you should avoid alcoholic beverages for 24 hours.  ACTIVITY:  You should plan to take  it easy for the rest of today and you should NOT DRIVE or use heavy machinery until tomorrow (because of the sedation medicines used during the test).    FOLLOW UP: Our staff will call the number listed on your records 48-72 hours following your procedure to check on you and address any questions or concerns that you may have regarding the information given to you following your procedure. If we do not reach you, we will leave a message.  We will attempt to reach you two times.  During this call, we will ask if you have developed any symptoms of COVID 19. If you develop any symptoms (ie: fever, flu-like symptoms, shortness of breath, cough etc.) before then, please call [phone].  If you test positive for Covid 19 in the 2 weeks post procedure, please call and report this information to us.    If any biopsies were taken you will be contacted by phone or by letter within the next 1-3 weeks.  Please call us at [phone] if you have not heard about the biopsies in 3 weeks.    SIGNATURES/CONFIDENTIALITY: You and/or your care partner have signed paperwork which will be entered into your electronic medical record.  These signatures attest to the fact that that the information above on your After Visit Summary has been reviewed and is understood.  Full responsibility of the confidentiality of this discharge information lies with you and/or your care-partner. 

## 2020-01-24 NOTE — Progress Notes (Signed)
Called to room to assist during endoscopic procedure.  Patient ID and intended procedure confirmed with present staff. Received instructions for my participation in the procedure from the performing physician.  

## 2020-01-24 NOTE — Progress Notes (Signed)
Temperature taken by L.C., VS taken by C.W. 

## 2020-01-24 NOTE — Op Note (Signed)
[name] Procedure Date: 01/24/2020 12:11 PM MRN: [id number] Endoscopist: Mallie Mussel L. Loletha Carrow , MD Age: 64 Referring MD:  Date of Birth: 12-20-1955 Gender: Male Account #: [id number] Procedure:                Colonoscopy Indications:              Colon cancer screening in patient at increased                            risk: Colorectal cancer in brother Medicines:                Monitored Anesthesia Care Procedure:                Pre-Anesthesia Assessment:                           - Prior to the procedure, a History and Physical                            was performed, and patient medications and                            allergies were reviewed. The patient's tolerance of                            previous anesthesia was also reviewed. The risks                            and benefits of the procedure and the sedation                            options and risks were discussed with the patient.                            All questions were answered, and informed consent                            was obtained. Prior Anticoagulants: The patient has                            taken no previous anticoagulant or antiplatelet                            agents. ASA Grade Assessment: II - A patient with                            mild systemic disease. After reviewing the risks                            and benefits, the patient was deemed in                            satisfactory condition to undergo the procedure.  After obtaining informed consent, the colonoscope                            was passed under direct vision. Throughout the                            procedure, the patient's blood pressure, pulse, and                            oxygen saturations were monitored continuously. The                            Colonoscope was introduced through the anus and                            advanced to the the cecum,  identified by                            appendiceal orifice and ileocecal valve. The                            colonoscopy was somewhat difficult due to a                            redundant colon and significant looping. Successful                            completion of the procedure was aided by using                            manual pressure. The patient tolerated the                            procedure well. The quality of the bowel                            preparation was good. The ileocecal valve,                            appendiceal orifice, and rectum were photographed.                            The bowel preparation used was 2 day Suprep/Miralax. Scope In: 12:18:31 PM Scope Out: 12:50:54 PM Scope Withdrawal Time: 0 hours 26 minutes 31 seconds  Total Procedure Duration: 0 hours 32 minutes 23 seconds  Findings:                 The perianal and digital rectal examinations were                            normal.                           Multiple diverticula were found in the left colon  and right colon.                           A 6 mm polyp was found in the ascending colon. The                            polyp was sessile. The polyp was removed with a                            piecemeal technique using a cold biopsy forceps.                            Resection and retrieval were complete.                           The exam was otherwise without abnormality on                            direct and retroflexion views. Complications:            No immediate complications. Estimated Blood Loss:     Estimated blood loss was minimal. Impression:               - Diverticulosis in the left colon and in the right                            colon.                           - One 6 mm polyp in the ascending colon, removed                            piecemeal using a cold biopsy forceps. Resected and                            retrieved.                            - The examination was otherwise normal on direct                            and retroflexion views. Recommendation:           - Patient has a contact number available for                            emergencies. The signs and symptoms of potential                            delayed complications were discussed with the                            patient. Return to normal activities tomorrow.                            Written discharge instructions were  provided to the                            patient.                           - Resume previous diet.                           - Continue present medications.                           - Await pathology results.                           - Repeat colonoscopy in 5 years for surveillance                            and family history. Nickolette Espinola L. Loletha Carrow, MD 01/24/2020 12:56:38 PM This report has been signed electronically.

## 2020-01-24 NOTE — Progress Notes (Signed)
Report given to PACU, vss 

## 2020-01-24 NOTE — Progress Notes (Signed)
Pt's states no medical or surgical changes since previsit or office visit. 

## 2020-01-26 ENCOUNTER — Telehealth: Payer: Self-pay | Admitting: *Deleted

## 2020-01-26 ENCOUNTER — Encounter: Payer: Self-pay | Admitting: Gastroenterology

## 2020-01-26 NOTE — Telephone Encounter (Signed)
  Follow up Call-  Call back number 01/24/2020 06/29/2019  Post procedure Call Back phone  # [phone]  Permission to leave phone message Yes -  Some recent data might be hidden     Patient questions:  Do you have a fever, pain , or abdominal swelling? No. Pain Score  0 *  Have you tolerated food without any problems? Yes.    Have you been able to return to your normal activities? Yes.    Do you have any questions about your discharge instructions: Diet   No. Medications  No. Follow up visit  No.  Do you have questions or concerns about your Care? No.  Actions: * If pain score is 4 or above: No action needed, pain <4.  1. Have you developed a fever since your procedure? no  2.   Have you had an respiratory symptoms (SOB or cough) since your procedure? no  3.   Have you tested positive for COVID 19 since your procedure no  4.   Have you had any family members/close contacts diagnosed with the COVID 19 since your procedure?  no   If yes to any of these questions please route to Joylene John, RN and Erenest Rasher, RN

## 2020-01-26 NOTE — Telephone Encounter (Signed)
  Follow up Call-  Call back number 01/24/2020 06/29/2019  Post procedure Call Back phone  # [phone]  Permission to leave phone message Yes -  Some recent data might be hidden     Patient questions:  Message left to call us if necessary.

## 2020-02-10 ENCOUNTER — Other Ambulatory Visit: Payer: Self-pay | Admitting: Family Medicine

## 2020-04-11 ENCOUNTER — Other Ambulatory Visit: Payer: Medicare Other

## 2020-04-18 ENCOUNTER — Other Ambulatory Visit: Payer: Medicare Other

## 2020-04-19 ENCOUNTER — Encounter: Payer: Medicare Other | Admitting: Family Medicine

## 2020-04-29 ENCOUNTER — Other Ambulatory Visit: Payer: Self-pay | Admitting: Family Medicine

## 2020-04-29 DIAGNOSIS — E785 Hyperlipidemia, unspecified: Secondary | ICD-10-CM

## 2020-05-01 ENCOUNTER — Other Ambulatory Visit: Payer: Self-pay | Admitting: Family Medicine

## 2020-05-02 NOTE — Telephone Encounter (Signed)
Refill request Pravastatin Last refill 02/14/20 #45 Upcoming appointment 05/10/20 See allergy/contraindication

## 2020-05-02 NOTE — Telephone Encounter (Signed)
Has tolerated.  Would continue.  rx sent.  Thanks.

## 2020-05-09 ENCOUNTER — Ambulatory Visit (INDEPENDENT_AMBULATORY_CARE_PROVIDER_SITE_OTHER): Payer: Medicare Other

## 2020-05-09 ENCOUNTER — Other Ambulatory Visit: Payer: Self-pay | Admitting: Family Medicine

## 2020-05-09 ENCOUNTER — Other Ambulatory Visit: Payer: Self-pay

## 2020-05-09 ENCOUNTER — Other Ambulatory Visit (INDEPENDENT_AMBULATORY_CARE_PROVIDER_SITE_OTHER): Payer: Medicare Other

## 2020-05-09 DIAGNOSIS — Z0000 Encounter for general adult medical examination without abnormal findings: Secondary | ICD-10-CM

## 2020-05-09 DIAGNOSIS — E785 Hyperlipidemia, unspecified: Secondary | ICD-10-CM | POA: Diagnosis not present

## 2020-05-09 DIAGNOSIS — Z125 Encounter for screening for malignant neoplasm of prostate: Secondary | ICD-10-CM

## 2020-05-09 LAB — COMPREHENSIVE METABOLIC PANEL
ALT: 34 U/L (ref 0–53)
AST: 21 U/L (ref 0–37)
Albumin: 4.4 g/dL (ref 3.5–5.2)
Alkaline Phosphatase: 51 U/L (ref 39–117)
BUN: 15 mg/dL (ref 6–23)
CO2: 29 mEq/L (ref 19–32)
Calcium: 9.1 mg/dL (ref 8.4–10.5)
Chloride: 106 mEq/L (ref 96–112)
Creatinine, Ser: 0.92 mg/dL (ref 0.40–1.50)
GFR: 83.12 mL/min (ref >60.00)
Glucose, Bld: 113 mg/dL — ABNORMAL HIGH (ref 70–99)
Potassium: 4.7 mEq/L (ref 3.5–5.1)
Sodium: 139 mEq/L (ref 135–145)
Total Bilirubin: 0.4 mg/dL (ref 0.2–1.2)
Total Protein: 6.7 g/dL (ref 6.0–8.3)

## 2020-05-09 LAB — LIPID PANEL
Cholesterol: 198 mg/dL (ref 0–200)
HDL: 39.5 mg/dL (ref >39.00)
LDL Cholesterol: 131 mg/dL — ABNORMAL HIGH (ref 0–99)
NonHDL: 158.13
Total CHOL/HDL Ratio: 5
Triglycerides: 136 mg/dL (ref 0.0–149.0)
VLDL: 27.2 mg/dL (ref 0.0–40.0)

## 2020-05-09 LAB — PSA, MEDICARE: PSA: 0.19 ng/ml (ref 0.10–4.00)

## 2020-05-09 NOTE — Progress Notes (Signed)
PCP notes:  Health Maintenance: No gaps noted   Abnormal Screenings: none   Patient concerns: none   Nurse concerns: none   Next PCP appt.: 05/10/2020 @ 8:15 am

## 2020-05-09 NOTE — Addendum Note (Signed)
Addended by: Ellamae Sia on: 05/09/2020 12:10 PM   Modules accepted: Orders

## 2020-05-09 NOTE — Patient Instructions (Signed)
Andrew [name] , Thank you for taking time to come for your Medicare Wellness Visit. I appreciate your ongoing commitment to your health goals. Please review the following plan we discussed and let me know if I can assist you in the future.   Screening recommendations/referrals: Colonoscopy: Up to date, completed 01/24/2020, due 01/2025 Recommended yearly ophthalmology/optometry visit for glaucoma screening and checkup Recommended yearly dental visit for hygiene and checkup  Vaccinations: Influenza vaccine: Up to date, completed 07/21/2019, due 05/2020 Pneumococcal vaccine: at age 78 Tdap vaccine: Up to date, completed 05/27/2013, due 05/2023 Shingles vaccine: due, check with your insurance regarding coverage   Covid-19: Completed series  Advanced directives: Please bring a copy of your POA (Power of Attorney) and/or Living Will to your next appointment.   Conditions/risks identified: hypercholesterolemia  Next appointment: Follow up in one year for your annual wellness visit.   Preventive Care 40-64 Years, Male Preventive care refers to lifestyle choices and visits with your health care provider that can promote health and wellness. What does preventive care include?  A yearly physical exam. This is also called an annual well check.  Dental exams once or twice a year.  Routine eye exams. Ask your health care provider how often you should have your eyes checked.  Personal lifestyle choices, including:  Daily care of your teeth and gums.  Regular physical activity.  Eating a healthy diet.  Avoiding tobacco and drug use.  Limiting alcohol use.  Practicing safe sex.  Taking low doses of aspirin every day.  Taking vitamin and mineral supplements as recommended by your health care provider. What happens during an annual well check? The services and screenings done by your health care provider during your annual well check will depend on your age, overall health, lifestyle risk factors,  and family history of disease. Counseling  Your health care provider may ask you questions about your:  Alcohol use.  Tobacco use.  Drug use.  Emotional well-being.  Home and relationship well-being.  Sexual activity.  Eating habits.  History of falls.  Memory and ability to understand (cognition).  Work and work Statistician. Screening  You may have the following tests or measurements:  Height, weight, and BMI.  Blood pressure.  Lipid and cholesterol levels. These may be checked every 5 years, or more frequently if you are over 69 years old.  Skin check.  Lung cancer screening. You may have this screening every year starting at age 76 if you have a 30-pack-year history of smoking and currently smoke or have quit within the past 15 years.  Fecal occult blood test (FOBT) of the stool. You may have this test every year starting at age 58.  Flexible sigmoidoscopy or colonoscopy. You may have a sigmoidoscopy every 5 years or a colonoscopy every 10 years starting at age 88.  Prostate cancer screening. Recommendations will vary depending on your family history and other risks.  Hepatitis C blood test.  Hepatitis B blood test.  Sexually transmitted disease (STD) testing.  Diabetes screening. This is done by checking your blood sugar (glucose) after you have not eaten for a while (fasting). You may have this done every 1-3 years.  Abdominal aortic aneurysm (AAA) screening. You may need this if you are a current or former smoker.  Osteoporosis. You may be screened starting at age 5 if you are at high risk. Talk with your health care provider about your test results, treatment options, and if necessary, the need for more tests. Vaccines  Your health care provider may recommend certain vaccines, such as:  Influenza vaccine. This is recommended every year.  Tetanus, diphtheria, and acellular pertussis (Tdap, Td) vaccine. You may need a Td booster every 10  years.  Zoster vaccine. You may need this after age 65.  Pneumococcal 13-valent conjugate (PCV13) vaccine. One dose is recommended after age 26.  Pneumococcal polysaccharide (PPSV23) vaccine. One dose is recommended after age 60. Talk to your health care provider about which screenings and vaccines you need and how often you need them. This information is not intended to replace advice given to you by your health care provider. Make sure you discuss any questions you have with your health care provider. Document Released: 11/02/2015 Document Revised: 06/25/2016 Document Reviewed: 08/07/2015 Elsevier Interactive Patient Education  2017 [place] Prevention in the Home Falls can cause injuries. They can happen to people of all ages. There are many things you can do to make your home safe and to help prevent falls. What can I do on the outside of my home?  Regularly fix the edges of walkways and driveways and fix any cracks.  Remove anything that might make you trip as you walk through a door, such as a raised step or threshold.  Trim any bushes or trees on the path to your home.  Use bright outdoor lighting.  Clear any walking paths of anything that might make someone trip, such as rocks or tools.  Regularly check to see if handrails are loose or broken. Make sure that both sides of any steps have handrails.  Any raised decks and porches should have guardrails on the edges.  Have any leaves, snow, or ice cleared regularly.  Use sand or salt on walking paths during winter.  Clean up any spills in your garage right away. This includes oil or grease spills. What can I do in the bathroom?  Use night lights.  Install grab bars by the toilet and in the tub and shower. Do not use towel bars as grab bars.  Use non-skid mats or decals in the tub or shower.  If you need to sit down in the shower, use a plastic, non-slip stool.  Keep the floor dry. Clean up any water that  spills on the floor as soon as it happens.  Remove soap buildup in the tub or shower regularly.  Attach bath mats securely with double-sided non-slip rug tape.  Do not have throw rugs and other things on the floor that can make you trip. What can I do in the bedroom?  Use night lights.  Make sure that you have a light by your bed that is easy to reach.  Do not use any sheets or blankets that are too big for your bed. They should not hang down onto the floor.  Have a firm chair that has side arms. You can use this for support while you get dressed.  Do not have throw rugs and other things on the floor that can make you trip. What can I do in the kitchen?  Clean up any spills right away.  Avoid walking on wet floors.  Keep items that you use a lot in easy-to-reach places.  If you need to reach something above you, use a strong step stool that has a grab bar.  Keep electrical cords out of the way.  Do not use floor polish or wax that makes floors slippery. If you must use wax, use non-skid floor wax.  Do  not have throw rugs and other things on the floor that can make you trip. What can I do with my stairs?  Do not leave any items on the stairs.  Make sure that there are handrails on both sides of the stairs and use them. Fix handrails that are broken or loose. Make sure that handrails are as long as the stairways.  Check any carpeting to make sure that it is firmly attached to the stairs. Fix any carpet that is loose or worn.  Avoid having throw rugs at the top or bottom of the stairs. If you do have throw rugs, attach them to the floor with carpet tape.  Make sure that you have a light switch at the top of the stairs and the bottom of the stairs. If you do not have them, ask someone to add them for you. What else can I do to help prevent falls?  Wear shoes that:  Do not have high heels.  Have rubber bottoms.  Are comfortable and fit you well.  Are closed at the  toe. Do not wear sandals.  If you use a stepladder:  Make sure that it is fully opened. Do not climb a closed stepladder.  Make sure that both sides of the stepladder are locked into place.  Ask someone to hold it for you, if possible.  Clearly mark and make sure that you can see:  Any grab bars or handrails.  First and last steps.  Where the edge of each step is.  Use tools that help you move around (mobility aids) if they are needed. These include:  Canes.  Walkers.  Scooters.  Crutches.  Turn on the lights when you go into a dark area. Replace any light bulbs as soon as they burn out.  Set up your furniture so you have a clear path. Avoid moving your furniture around.  If any of your floors are uneven, fix them.  If there are any pets around you, be aware of where they are.  Review your medicines with your doctor. Some medicines can make you feel dizzy. This can increase your chance of falling. Ask your doctor what other things that you can do to help prevent falls. This information is not intended to replace advice given to you by your health care provider. Make sure you discuss any questions you have with your health care provider. Document Released: 08/02/2009 Document Revised: 03/13/2016 Document Reviewed: 11/10/2014 Elsevier Interactive Patient Education  2017 [employer].

## 2020-05-09 NOTE — Progress Notes (Signed)
Subjective:   Andrew [name] is a 64 y.o. male who presents for Medicare Annual/Subsequent preventive examination.  Review of Systems: N/A      I connected with the patient today by telephone and verified that I am speaking with the correct person using two identifiers. Location patient: home Location nurse: work Persons participating in the virtual visit: patient, Marine scientist.   I discussed the limitations, risks, security and privacy concerns of performing an evaluation and management service by telephone and the availability of in person appointments. I also discussed with the patient that there may be a patient responsible charge related to this service. The patient expressed understanding and verbally consented to this telephonic visit.    Interactive audio and video telecommunications were attempted between this nurse and patient, however failed, due to patient having technical difficulties OR patient did not have access to video capability.  We continued and completed visit with audio only.     Cardiac Risk Factors include: advanced age (>18men, >24 women);male gender;Other (see comment), Risk factor comments: hypercholesterolemia     Objective:    Today's Vitals   05/09/20 1401  PainSc: 5    There is no height or weight on file to calculate BMI.  Advanced Directives 05/09/2020 08/12/2019 04/13/2019 04/07/2018 03/26/2016 03/26/2016 12/26/2014  Does Patient Have a Medical Advance Directive? Yes Yes Yes Yes Yes Yes Yes  Type of Paramedic of [place];Living will [place];Living will [place];Living will [place];Living will [place];Living will [place];Living will -  Does patient want to make changes to medical advance directive? - - - - Yes - information given Yes - information given -  Copy of [place] in Chart? No - copy requested - No - copy  requested No - copy requested No - copy requested No - copy requested No - copy requested    Current Medications (verified) Outpatient Encounter Medications as of 05/09/2020  Medication Sig  . acetaminophen (TYLENOL) 650 MG CR tablet Take 1,300 mg by mouth every 8 (eight) hours as needed for pain.  . calcium carbonate (TUMS - DOSED IN MG ELEMENTAL CALCIUM) 500 MG chewable tablet Chew 1 tablet by mouth daily as needed for indigestion or heartburn.  . celecoxib (CELEBREX) 100 MG capsule Take 100 mg by mouth 2 (two) times daily.  Marland Kitchen docusate sodium (COLACE) 50 MG capsule Take 50 mg by mouth 2 (two) times daily as needed for mild constipation.  . methocarbamol (ROBAXIN) 500 MG tablet Take 1 tablet (500 mg total) by mouth every 6 (six) hours as needed for muscle spasms.  . Omega-3 Fatty Acids (FISH OIL) 1000 MG CAPS Take 1,000 mg by mouth daily.  . pravastatin (PRAVACHOL) 20 MG tablet TAKE 1 TABLET BY MOUTH  EVERY OTHER DAY  . sildenafil (REVATIO) 20 MG tablet Take 3-5 tablets (60-100 mg total) by mouth daily as needed. (Patient taking differently: Take 60-100 mg by mouth daily as needed (ED). )  . tiZANidine (ZANAFLEX) 4 MG tablet Take 0.5-1 tablets (2-4 mg total) by mouth every 6 (six) hours as needed for muscle spasms.  . traMADol (ULTRAM) 50 MG tablet Take 1 tablet (50 mg total) by mouth every 8 (eight) hours as needed. (Patient taking differently: Take 50 mg by mouth every 8 (eight) hours as needed for moderate pain. )   No facility-administered encounter medications on file as of 05/09/2020.    Allergies (verified) Hydromorphone,  Lyrica [pregabalin], Neurontin [gabapentin], Atorvastatin, Celebrex [celecoxib], Escitalopram oxalate, and Sertraline hcl   History: Past Medical History:  Diagnosis Date  . Anxiety    many years ago but situational   . Arthritis   . Back pain    With spinal cord stimulator.  S/p lumbar fusion, diskectomy.  Prev seen by Dr. Vertell Limber   . Complication of anesthesia    . Constipation    uses Colace- he goes every 4-5 days- some hard stools   . Diverticulitis   . Diverticulosis   . Erectile dysfunction   . GERD (gastroesophageal reflux disease)    OCC   . Hyperlipidemia   . Leg paresthesia    L foot numb- since 1999 surgery  . Neuromuscular disorder ([place])    siatica   . PONV (postoperative nausea and vomiting)    Past Surgical History:  Procedure Laterality Date  . ANTERIOR LAT LUMBAR FUSION Right 08/16/2019   Procedure: ANTERIOR LATERAL LUMBAR FUSION  RIGHT LUMBAR TWO- LUMBAR THREE WITH LATERAL PLATE;  Surgeon: Erline Levine, MD;  Location: [facility];  Service: Neurosurgery;  Laterality: Right;  ANTERIOR LATERAL LUMBAR FUSION  RIGHT LUMBAR 2- LUMBAR 3 WITH LATERAL PLATE  . APPENDECTOMY    . BACK SURGERY     Lumbar back surgery L3/4 ans L5/S1 rods and screws L3-S1 (Dr Carloyn Manner) 02/29/2008  . COLONOSCOPY    . LAMINECTOMY      L4/5 1975,  L5/S1 1991  . POLYPECTOMY    . SHOULDER SURGERY Right   . SPINAL CORD STIMULATOR IMPLANT     Family History  Problem Relation Age of Onset  . Alzheimer's disease Mother   . Dementia Mother   . Heart disease Father   . Hyperlipidemia Father   . Dementia Father   . Colon cancer Brother   . Colon polyps Sister   . Prostate cancer Neg Hx   . Esophageal cancer Neg Hx   . Rectal cancer Neg Hx   . Stomach cancer Neg Hx    Social History   Socioeconomic History  . Marital status: Married    Spouse name: Not on file  . Number of children: 1  . Years of education: Not on file  . Highest education level: Not on file  Occupational History  . Occupation: retired, because of back  Tobacco Use  . Smoking status: Former Research scientist (life sciences)  . Smokeless tobacco: Former Network engineer  . Vaping Use: Never used  Substance and Sexual Activity  . Alcohol use: Not Currently    Alcohol/week: 0.0 standard drinks  . Drug use: No  . Sexual activity: Yes  Other Topics Concern  . Not on file  Social History Narrative   Disabled  from back pain   Prev EMS/ambulance driver   Divorced, remarried 2015   Social Determinants of Health   Financial Resource Strain: Low Risk   . Difficulty of Paying Living Expenses: Not hard at all  Food Insecurity: No Food Insecurity  . Worried About Charity fundraiser in the Last Year: Never true  . Ran Out of Food in the Last Year: Never true  Transportation Needs: No Transportation Needs  . Lack of Transportation (Medical): No  . Lack of Transportation (Non-Medical): No  Physical Activity: Sufficiently Active  . Days of Exercise per Week: 7 days  . Minutes of Exercise per Session: 30 min  Stress: No Stress Concern Present  . Feeling of Stress : Not at all  Social Connections:   .  Frequency of Communication with Friends and Family:   . Frequency of Social Gatherings with Friends and Family:   . Attends Religious Services:   . Active Member of Clubs or Organizations:   . Attends Archivist Meetings:   Marland Kitchen Marital Status:     Tobacco Counseling Counseling given: Not Answered   Clinical Intake:  Pre-visit preparation completed: Yes  Pain : 0-10 Pain Score: 5  Pain Type: Chronic pain Pain Location: Back Pain Orientation: Lower Pain Descriptors / Indicators: Aching Pain Onset: More than a month ago Pain Frequency: Constant     Nutritional Risks: None Diabetes: No  How often do you need to have someone help you when you read instructions, pamphlets, or other written materials from your doctor or pharmacy?: 1 - Never What is the last grade level you completed in school?: bachelors  Diabetic: No Nutrition Risk Assessment:  Has the patient had any N/V/D within the last 2 months?  No  Does the patient have any non-healing wounds?  No  Has the patient had any unintentional weight loss or weight gain?  No   Diabetes:  Is the patient diabetic?  No  If diabetic, was a CBG obtained today?  No  Did the patient bring in their glucometer from home?  No  How  often do you monitor your CBG's? N/A.   Financial Strains and Diabetes Management:  Are you having any financial strains with the device, your supplies or your medication? No .  Does the patient want to be seen by Chronic Care Management for management of their diabetes?  No  Would the patient like to be referred to a Nutritionist or for Diabetic Management?  No    Interpreter Needed?: No  Information entered by :: CJohnson, LPN   Activities of Daily Living In your present state of health, do you have any difficulty performing the following activities: 05/09/2020 08/12/2019  Hearing? Tempie Donning  Comment has tried hearing aids, background noise issues -  Vision? N N  Difficulty concentrating or making decisions? N N  Walking or climbing stairs? N Y  Dressing or bathing? N N  Doing errands, shopping? N N  Preparing Food and eating ? N -  Using the Toilet? N -  In the past six months, have you accidently leaked urine? N -  Do you have problems with loss of bowel control? N -  Managing your Medications? N -  Managing your Finances? N -  Housekeeping or managing your Housekeeping? N -  Some recent data might be hidden    Patient Care Team: Tonia Ghent, MD as PCP - General Justice Britain, MD as Consulting Physician (Orthopedic Surgery) Erline Levine, MD as Consulting Physician (Neurosurgery)  Indicate any recent Medical Services you may have received from other than [facility] providers in the past year (date may be approximate).     Assessment:   This is a routine wellness examination for [place].  Hearing/Vision screen  Hearing Screening   125Hz  250Hz  500Hz  1000Hz  2000Hz  3000Hz  4000Hz  6000Hz  8000Hz   Right ear:           Left ear:           Vision Screening Comments: Patient gets annual eye exams  Dietary issues and exercise activities discussed: Current Exercise Habits: Home exercise routine, Type of exercise: walking, Time (Minutes): 30, Frequency (Times/Week): 7, Weekly Exercise  (Minutes/Week): 210, Intensity: Moderate, Exercise limited by: None identified  Goals    . Patient Stated  Starting 04/13/2019, I will continue to take medications as prescribed.     . Patient Stated     05/09/2020, I will continue to walk everyday for 30 minutes- 1 hour.       Depression Screen PHQ 2/9 Scores 05/09/2020 04/13/2019 04/07/2018 06/03/2017 03/26/2016 03/26/2016 06/07/2015  PHQ - 2 Score 0 0 0 0 0 0 0  PHQ- 9 Score 0 0 0 - - - -  Exception Documentation - Medical reason - - - - -    Fall Risk Fall Risk  05/09/2020 04/13/2019 04/07/2018 06/03/2017 03/26/2016  Falls in the past year? 1 0 No No No  Comment tripped on sidewalk - - - -  Number falls in past yr: 0 - - - -  Injury with Fall? 1 - - - -  Comment broke wrist - - - -  Risk for fall due to : Medication side effect - - - -  Follow up Falls evaluation completed;Falls prevention discussed - - - -    Any stairs in or around the home? Yes  If so, are there any without handrails? No  Home free of loose throw rugs in walkways, pet beds, electrical cords, etc? Yes  Adequate lighting in your home to reduce risk of falls? Yes   ASSISTIVE DEVICES UTILIZED TO PREVENT FALLS:  Life alert? No  Use of a cane, walker or w/c? No  Grab bars in the bathroom? No  Shower chair or bench in shower? No  Elevated toilet seat or a handicapped toilet? No   TIMED UP AND GO:  Was the test performed? N/A, telephonic visit .   Cognitive Function: MMSE - Mini Mental State Exam 05/09/2020 04/13/2019 04/07/2018 03/26/2016  Orientation to time 5 5 5 5   Orientation to Place 5 5 5 5   Registration 3 3 3 3   Attention/ Calculation 5 0 0 0  Recall 3 3 3 3   Language- name 2 objects - 0 0 0  Language- repeat 1 1 1 1   Language- follow 3 step command - 0 3 3  Language- read & follow direction - 0 0 0  Write a sentence - 0 0 0  Copy design - 0 0 0  Total score - 17 20 20   Mini Cog  Mini-Cog screen was completed. Maximum score is 22. A value of 0 denotes  this part of the MMSE was not completed or the patient failed this part of the Mini-Cog screening.       Immunizations Immunization History  Administered Date(s) Administered  . Influenza Whole 07/20/2013  . Influenza,inj,Quad PF,6+ Mos 07/13/2017, 07/26/2018, 07/21/2019  . Influenza-Unspecified 07/13/2017, 04/25/2018, 07/26/2018, 07/21/2019  . Moderna SARS-COVID-2 Vaccination 11/23/2019, 12/22/2019  . Td 01/02/2003  . Tdap 05/27/2013    TDAP status: Up to date Flu Vaccine status: Up to date Pneumococcal vaccine status: Up to date Covid-19 vaccine status: Completed vaccines  Qualifies for Shingles Vaccine? Yes   Zostavax completed No   Shingrix Completed?: No.    Education has been provided regarding the importance of this vaccine. Patient has been advised to call insurance company to determine out of pocket expense if they have not yet received this vaccine. Advised may also receive vaccine at local pharmacy or Health Dept. Verbalized acceptance and understanding.  Screening Tests Health Maintenance  Topic Date Due  . INFLUENZA VACCINE  05/20/2020  . TETANUS/TDAP  05/28/2023  . COLONOSCOPY  01/23/2025  . COVID-19 Vaccine  Completed  . Hepatitis C Screening  Completed  .  HIV Screening  Completed    Health Maintenance  There are no preventive care reminders to display for this patient.  Colorectal cancer screening: Completed 01/24/2020. Repeat every 5 years  Lung Cancer Screening: (Low Dose CT Chest recommended if Age 65-80 years, 30 pack-year currently smoking OR have quit w/in 15years.) does not qualify.    Additional Screening:  Hepatitis C Screening: does qualify; Completed 10/02/2015  Vision Screening: Recommended annual ophthalmology exams for early detection of glaucoma and other disorders of the eye. Is the patient up to date with their annual eye exam?  Yes  Who is the provider or what is the name of the office in which the patient attends annual eye exams?  Dr. Mendel Ryder, [facility] If pt is not established with a provider, would they like to be referred to a provider to establish care? No .   Dental Screening: Recommended annual dental exams for proper oral hygiene  Community Resource Referral / Chronic Care Management: CRR required this visit?  No   CCM required this visit?  No      Plan:     I have personally reviewed and noted the following in the patient's chart:   . Medical and social history . Use of alcohol, tobacco or illicit drugs  . Current medications and supplements . Functional ability and status . Nutritional status . Physical activity . Advanced directives . List of other physicians . Hospitalizations, surgeries, and ER visits in previous 12 months . Vitals . Screenings to include cognitive, depression, and falls . Referrals and appointments  In addition, I have reviewed and discussed with patient certain preventive protocols, quality metrics, and best practice recommendations. A written personalized care plan for preventive services as well as general preventive health recommendations were provided to patient.   Due to this being a telephonic visit, the after visit summary with patients personalized plan was offered to patient via mail or my-chart. Patient preferred to pick up at office at next visit.   Andrez Grime, LPN   9/[id number]/[id number]

## 2020-05-10 ENCOUNTER — Ambulatory Visit (INDEPENDENT_AMBULATORY_CARE_PROVIDER_SITE_OTHER): Payer: Medicare Other | Admitting: Family Medicine

## 2020-05-10 ENCOUNTER — Encounter: Payer: Self-pay | Admitting: Family Medicine

## 2020-05-10 VITALS — BP 126/78 | HR 95 | Temp 96.6°F | Ht 76.0 in | Wt 227.4 lb

## 2020-05-10 DIAGNOSIS — G8929 Other chronic pain: Secondary | ICD-10-CM

## 2020-05-10 DIAGNOSIS — M545 Low back pain, unspecified: Secondary | ICD-10-CM

## 2020-05-10 DIAGNOSIS — Z7189 Other specified counseling: Secondary | ICD-10-CM

## 2020-05-10 DIAGNOSIS — E7800 Pure hypercholesterolemia, unspecified: Secondary | ICD-10-CM | POA: Diagnosis not present

## 2020-05-10 DIAGNOSIS — N529 Male erectile dysfunction, unspecified: Secondary | ICD-10-CM

## 2020-05-10 DIAGNOSIS — R739 Hyperglycemia, unspecified: Secondary | ICD-10-CM

## 2020-05-10 DIAGNOSIS — Z0000 Encounter for general adult medical examination without abnormal findings: Secondary | ICD-10-CM

## 2020-05-10 MED ORDER — CELECOXIB 100 MG PO CAPS: 100.0000 mg | ORAL_CAPSULE | Freq: Two times a day (BID) | ORAL | 3 refills | Status: DC

## 2020-05-10 MED ORDER — PRAVASTATIN SODIUM 20 MG PO TABS: 20.0000 mg | ORAL_TABLET | Freq: Every day | ORAL | 3 refills | Status: DC

## 2020-05-10 MED ORDER — SILDENAFIL CITRATE 20 MG PO TABS: 60.0000 mg | ORAL_TABLET | Freq: Every day | ORAL | 12 refills | Status: DC | PRN

## 2020-05-10 NOTE — Assessment & Plan Note (Signed)
Has been taking pravastatin QOD.  We talked about trying pravastatin daily.  See avs.  He'll update me as needed.

## 2020-05-10 NOTE — Assessment & Plan Note (Signed)
He had L2 L3 fusion last year and his back pain is clearly better.  He isn't pain free but is clearly better.  No recent tramadol use. Taking celebrex and tylenol at baseline.  Continue as is with stretching.  He'll update me as needed.

## 2020-05-10 NOTE — Patient Instructions (Signed)
Try taking pravastatin daily but if you have more aches the cut back to every other day and update me.  Take care.  Glad to see you. Thanks for your effort.

## 2020-05-10 NOTE — Progress Notes (Signed)
This visit occurred during the SARS-CoV-2 public health emergency.  Safety protocols were in place, including screening questions prior to the visit, additional usage of staff PPE, and extensive cleaning of exam room while observing appropriate contact time as indicated for disinfecting solutions.  Wearing a L wrist brace, fell about 2 weeks ago and is improved in the meantime with a wrist brace.  He saw ortho in the meantime.    He had L2 L3 fusion last year and his back pain is clearly better.  He isn't pain free but is clearly better.  No recent tramadol use. Taking celebrex and tylenol at baseline.    Elevated Cholesterol: Using medications without problems: yes Muscle aches: likely not from statin.  Diet compliance: d/w pt.   Exercise: d/w pt.   Has been taking pravastatin QOD.  We talked about trying pravastatin daily.  See avs.   Labs d/w pt.   ED prev treated with sildenafil, no ADE on med.    Tetanus 2014 PNA not due Asked pt to check on coverage for shingrix.   Flu shot done yearly.   covid 2021 PSA wnl 2021 Colon 2021 Living will d/w pt.  Wife [name] designated if patient were incapacitated.   Mild hyperglycemia d/w pt.  Diet and exercise d/w pt.    PMH and SH reviewed  Meds, vitals, and allergies reviewed.   ROS: Per HPI unless specifically indicated in ROS section   GEN: nad, alert and oriented HEENT: ncat NECK: supple w/o LA CV: rrr. PULM: ctab, no inc wob ABD: soft, +bs EXT: no edema SKIN: no acute rash, scar well healed from prev back surgery.  . L wrist in OTC brace with normal R grip (since he is clearly improved per his report and has seen ortho, he'll continue prn brace use per report and update me vs ortho if needed)  At least 30 minutes were devoted to patient care in this encounter (this can potentially include time spent reviewing the patient's file/history, interviewing and examining the patient, counseling/reviewing plan with patient, ordering  referrals, ordering tests, reviewing relevant laboratory or x-ray data, and documenting the encounter).

## 2020-05-10 NOTE — Assessment & Plan Note (Signed)
ED prev treated with sildenafil, no ADE on med.  Reasonable to continue prn with routine cautions.

## 2020-05-10 NOTE — Assessment & Plan Note (Signed)
Mild hyperglycemia d/w pt.  Diet and exercise d/w pt.  

## 2020-05-10 NOTE — Assessment & Plan Note (Signed)
Tetanus 2014 PNA not due Asked pt to check on coverage for shingrix.   Flu shot done yearly.   covid 2021 PSA wnl 2021 Colon 2021 Living will d/w pt.  Wife [name] designated if patient were incapacitated.

## 2020-05-10 NOTE — Assessment & Plan Note (Signed)
Living will d/w pt.  Wife Judy designated if patient were incapacitated.   

## 2020-06-22 IMAGING — CT CT T SPINE W/ CM
1 series · 9 of 14 positions shown, 12 images · non-contrast
Comparison: Lumbar spine x-rays dated June 13, 2019.
COMPARISON: Lumbar spine x-rays dated June 13, 2019.

Addendum:
CLINICAL DATA: Chronic right hip pain radiating down the outside of
the leg to the foot. Recent left hip pain and left foot numbness.
History of prior fusion.
TECHNIQUE: Contiguous axial images were obtained through the thoracic and
lumbar spine after the intrathecal infusion of contrast. Coronal and
sagittal reconstructions were obtained of the axial image sets.

[Series 3: t spine soft · axial · 0.33mm/px · z∈[-450,-132]mm · 9 of 126 slices shown, 12 images]
[im 10/126  soft-tissue]
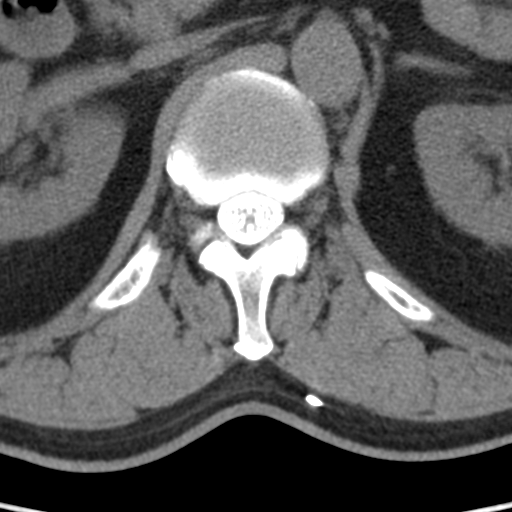
[im 10/126  bone]
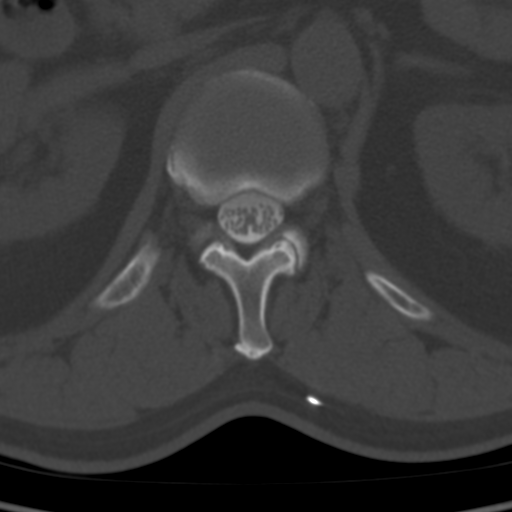
[im 29/126  bone]
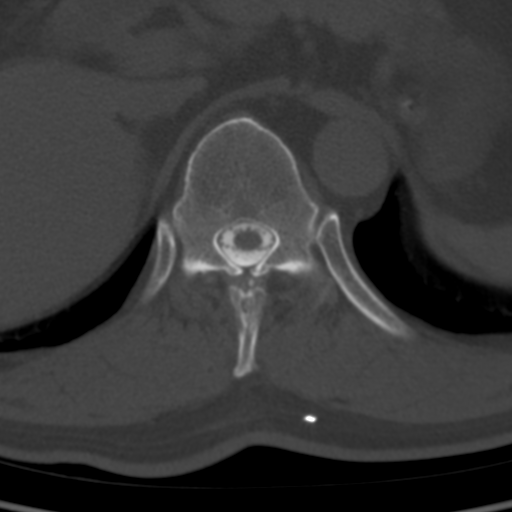
[im 39/126  bone]
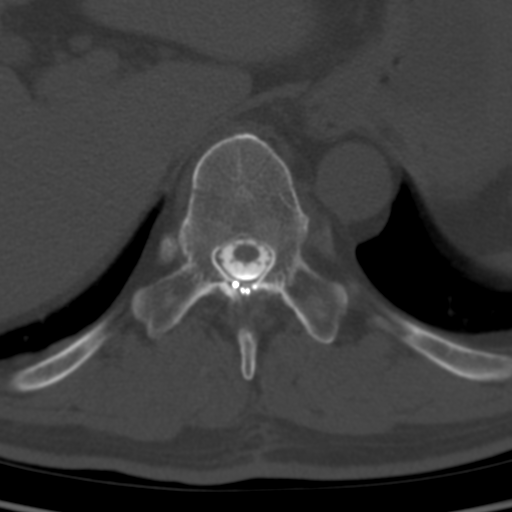
[im 49/126  bone]
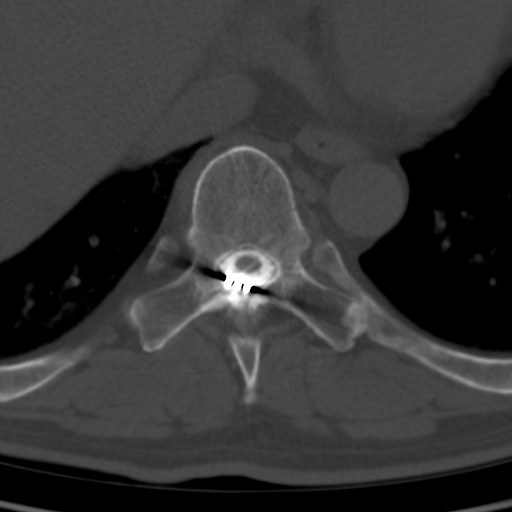
[im 68/126  soft-tissue]
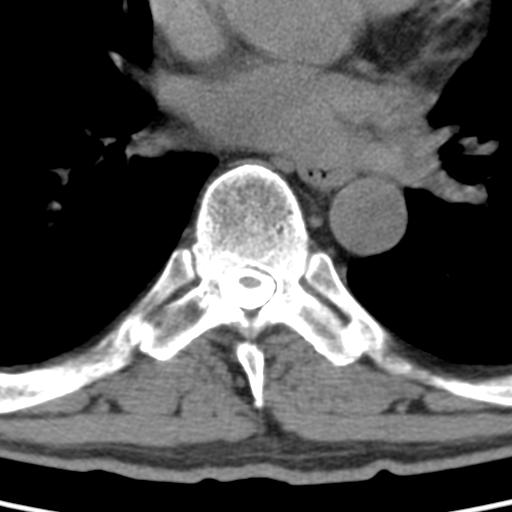
[im 68/126  bone]
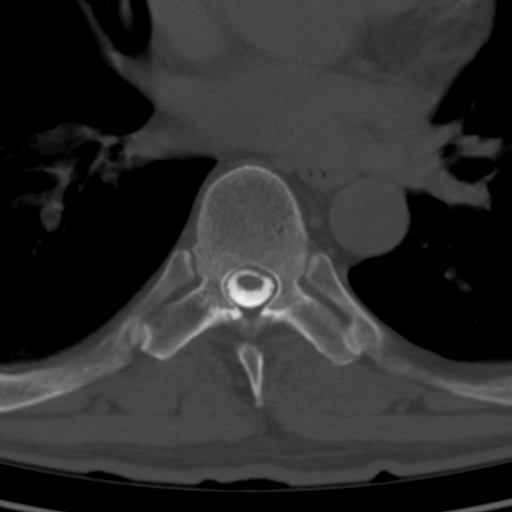
[im 77/126  bone]
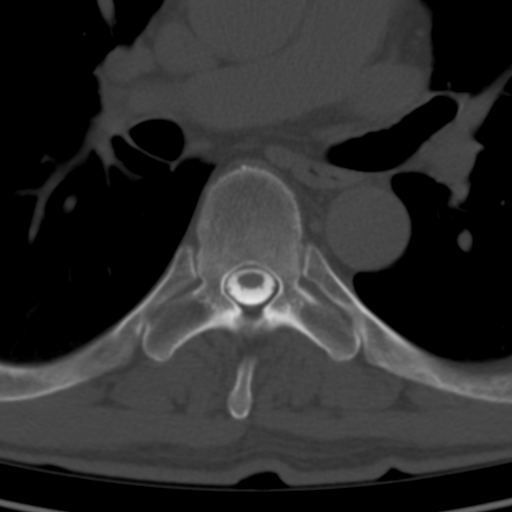
[im 87/126  bone]
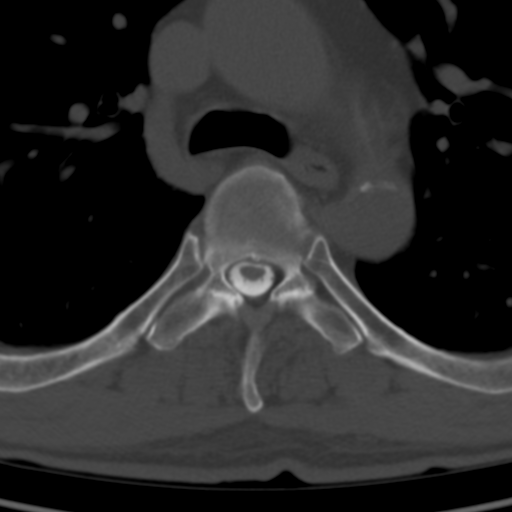
[im 106/126  bone]
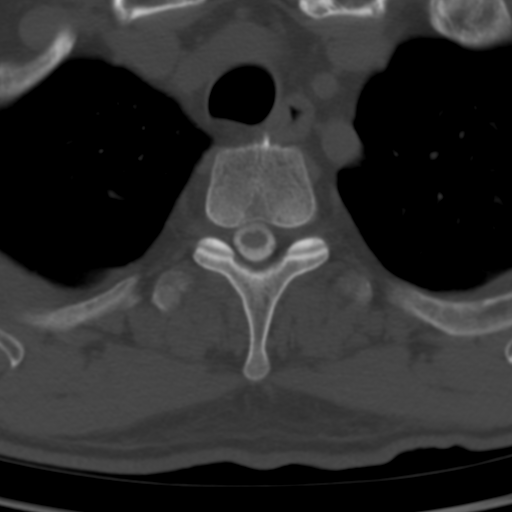
[im 116/126  soft-tissue]
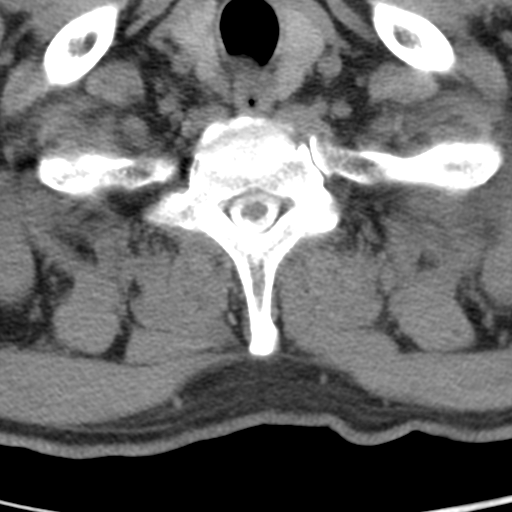
[im 116/126  bone]
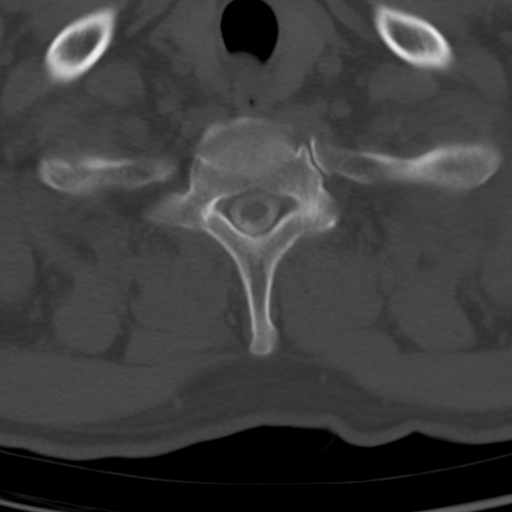

[9 of 14 positions shown; findings below may reference images not displayed]

EXAM:
THORACIC AND LUMBAR MYELOGRAM

CT THORACIC MYELOGRAM

CT LUMBAR MYELOGRAM

FLUOROSCOPY TIME:  Radiation Exposure Index (as provided by the
fluoroscopic device): 19.3 mGy

Fluoroscopy Time:  31 seconds

Number of Acquired Images:  21

PROCEDURE:
LUMBAR PUNCTURE FOR THORACIC AND LUMBAR MYELOGRAM

After thorough discussion of risks and benefits of the procedure
including bleeding, infection, injury to nerves, blood vessels,
adjacent structures as well as headache and CSF leak, written and
oral informed consent was obtained. Consent was obtained by Dr.
Demarco Henson.

Patient was positioned prone on the fluoroscopy table. Local
anesthesia was provided with 1% lidocaine without epinephrine after
prepped and draped in the usual sterile fashion. Puncture was
performed at L3-L4 using a 3 1/2 inch 22-gauge spinal needle via
midline approach. Using a single pass through the dura, the needle
was placed within the thecal sac, with return of clear CSF. 10 mL of
Isovue 8-733 was injected into the thecal sac, with normal
opacification of the nerve roots and cauda equina consistent with
free flow within the subarachnoid space. The patient was then moved
to the trendelenburg position and contrast flowed into the thoracic
spine region.

I personally performed the lumbar puncture and administered the
intrathecal contrast. I also personally supervised acquisition of
the myelogram images.
CT lumbar
myelogram dated March 14, 2011. Thoracic spine x-rays dated March 21, 2009.
FINDINGS: THORACIC AND LUMBAR MYELOGRAM FINDINGS:

Thoracic spine: Alignment is normal. Small ventral extradural defect
at T7-T8. Spinal cord stimulator leads terminating at the T8-T9
level. No spinal canal stenosis.

Lumbar spine: Trace retrolisthesis at L2-L3 when prone reduces with
flexion. Prior L3-S1 fusion. Small ventral extradural defect and
moderate spinal canal stenosis at L2-L3, worsened with standing or
extension, improved with flexion. Tiny ventral extradural defect at
L1-L2. No nerve root effacement.

CT THORACIC MYELOGRAM FINDINGS:

Alignment: Physiologic.

Vertebrae: No acute fracture or other focal pathologic process.

Cord: Normal in bulk and morphology.

Paraspinal and other soft tissues: Dorsal spinal cord stimulator
entering the epidural space at T9-T10 with leads terminating at
T8-T9.

Disc levels:

T1-T2: Negative.

T2-T3: Small shallow left subarticular disc protrusion. No stenosis.

T3-T4: Small left paracentral disc osteophyte complex with slight
mass effect on the left ventral cord. No stenosis.

T4-T5: Central endplate spurring with slight mass effect on the left
ventral cord. No significant disc bulge or herniation. No stenosis.

T5-T6: Small right paracentral disc protrusion with slight mass
effect on the right ventral cord. No stenosis.

T6-T7: Small left paracentral disc protrusion with slight mass
effect on the left ventral cord. No stenosis.

T7-T8: Small central disc protrusion with effacement of the ventral
thecal sac and slight flattening of the ventral cord. No stenosis.

T8-T9 to T11-T12: Negative.

CT LUMBAR MYELOGRAM FINDINGS:

Segmentation: Standard.

Alignment: Trace retrolisthesis at L2-L3.

Vertebrae: Prior L3-S1 PLIF. No evidence of hardware failure or
loosening. No acute fracture or other focal pathologic process.

Conus medullaris and cauda equina: Conus extends to the T12-L1
level. Conus and cauda equina appear normal.

Paraspinal and other soft tissues: Aortic atherosclerosis.

Disc levels:

T12-L1:  Negative.

L1-L2: New mild disc bulging with superimposed small left
subarticular disc protrusion. Slight posterior displacement of the
descending left L2 nerve root. New mild left neuroforaminal
stenosis. No spinal canal or right neuroforaminal stenosis.

L2-L3: Progressive moderate disc bulging and mild-to-moderate
bilateral facet arthropathy with ligamentum flavum hypertrophy. New
moderate to severe spinal canal stenosis and mild left greater than
right neuroforaminal stenosis.

L3-L4:  Prior posterior decompression and fusion.  No stenosis.

L4-L5:  Prior posterior decompression and fusion.  No stenosis.

L5-S1: Prior posterior decompression and fusion. Unchanged left
greater than right neuroforaminal endplate spurring. No stenosis.
IMPRESSION: Thoracic spine:

1. Mild multilevel thoracic spondylosis as described above.
Scattered small disc protrusions with slight mass effect on the
ventral cord. No stenosis.

Lumbar spine:

1. Progressive adjacent segment disease at L2-L3 with new moderate
to severe spinal canal stenosis. This worsens with standing and
extension, but improves with flexion.
2. New small left subarticular disc protrusion at L1-L2 with
posterior displacement of the descending left L2 nerve root in the
lateral recess.
3. Prior L3-S1 fusion without residual stenosis.

ADDENDUM:
Please note that the lumbar puncture was actually performed at the
L1-L2 level from a right interlaminar approach.

*** End of Addendum ***
EXAM:
THORACIC AND LUMBAR MYELOGRAM

CT THORACIC MYELOGRAM

CT LUMBAR MYELOGRAM

FLUOROSCOPY TIME:  Radiation Exposure Index (as provided by the
fluoroscopic device): 19.3 mGy

Fluoroscopy Time:  31 seconds

Number of Acquired Images:  21

PROCEDURE:
LUMBAR PUNCTURE FOR THORACIC AND LUMBAR MYELOGRAM

After thorough discussion of risks and benefits of the procedure
including bleeding, infection, injury to nerves, blood vessels,
adjacent structures as well as headache and CSF leak, written and
oral informed consent was obtained. Consent was obtained by Dr.
Demarco Henson.

Patient was positioned prone on the fluoroscopy table. Local
anesthesia was provided with 1% lidocaine without epinephrine after
prepped and draped in the usual sterile fashion. Puncture was
performed at L3-L4 using a 3 1/2 inch 22-gauge spinal needle via
midline approach. Using a single pass through the dura, the needle
was placed within the thecal sac, with return of clear CSF. 10 mL of
Isovue 8-733 was injected into the thecal sac, with normal
opacification of the nerve roots and cauda equina consistent with
free flow within the subarachnoid space. The patient was then moved
to the trendelenburg position and contrast flowed into the thoracic
spine region.

I personally performed the lumbar puncture and administered the
intrathecal contrast. I also personally supervised acquisition of
the myelogram images.
CT lumbar
myelogram dated March 14, 2011. Thoracic spine x-rays dated March 21, 2009.
FINDINGS: THORACIC AND LUMBAR MYELOGRAM FINDINGS:

Thoracic spine: Alignment is normal. Small ventral extradural defect
at T7-T8. Spinal cord stimulator leads terminating at the T8-T9
level. No spinal canal stenosis.

Lumbar spine: Trace retrolisthesis at L2-L3 when prone reduces with
flexion. Prior L3-S1 fusion. Small ventral extradural defect and
moderate spinal canal stenosis at L2-L3, worsened with standing or
extension, improved with flexion. Tiny ventral extradural defect at
L1-L2. No nerve root effacement.

CT THORACIC MYELOGRAM FINDINGS:

Alignment: Physiologic.

Vertebrae: No acute fracture or other focal pathologic process.

Cord: Normal in bulk and morphology.

Paraspinal and other soft tissues: Dorsal spinal cord stimulator
entering the epidural space at T9-T10 with leads terminating at
T8-T9.

Disc levels:

T1-T2: Negative.

T2-T3: Small shallow left subarticular disc protrusion. No stenosis.

T3-T4: Small left paracentral disc osteophyte complex with slight
mass effect on the left ventral cord. No stenosis.

T4-T5: Central endplate spurring with slight mass effect on the left
ventral cord. No significant disc bulge or herniation. No stenosis.

T5-T6: Small right paracentral disc protrusion with slight mass
effect on the right ventral cord. No stenosis.

T6-T7: Small left paracentral disc protrusion with slight mass
effect on the left ventral cord. No stenosis.

T7-T8: Small central disc protrusion with effacement of the ventral
thecal sac and slight flattening of the ventral cord. No stenosis.

T8-T9 to T11-T12: Negative.

CT LUMBAR MYELOGRAM FINDINGS:

Segmentation: Standard.

Alignment: Trace retrolisthesis at L2-L3.

Vertebrae: Prior L3-S1 PLIF. No evidence of hardware failure or
loosening. No acute fracture or other focal pathologic process.

Conus medullaris and cauda equina: Conus extends to the T12-L1
level. Conus and cauda equina appear normal.

Paraspinal and other soft tissues: Aortic atherosclerosis.

Disc levels:

T12-L1:  Negative.

L1-L2: New mild disc bulging with superimposed small left
subarticular disc protrusion. Slight posterior displacement of the
descending left L2 nerve root. New mild left neuroforaminal
stenosis. No spinal canal or right neuroforaminal stenosis.

L2-L3: Progressive moderate disc bulging and mild-to-moderate
bilateral facet arthropathy with ligamentum flavum hypertrophy. New
moderate to severe spinal canal stenosis and mild left greater than
right neuroforaminal stenosis.

L3-L4:  Prior posterior decompression and fusion.  No stenosis.

L4-L5:  Prior posterior decompression and fusion.  No stenosis.

L5-S1: Prior posterior decompression and fusion. Unchanged left
greater than right neuroforaminal endplate spurring. No stenosis.
IMPRESSION: Thoracic spine:

1. Mild multilevel thoracic spondylosis as described above.
Scattered small disc protrusions with slight mass effect on the
ventral cord. No stenosis.

Lumbar spine:

1. Progressive adjacent segment disease at L2-L3 with new moderate
to severe spinal canal stenosis. This worsens with standing and
extension, but improves with flexion.
2. New small left subarticular disc protrusion at L1-L2 with
posterior displacement of the descending left L2 nerve root in the
lateral recess.
3. Prior L3-S1 fusion without residual stenosis.

## 2020-07-24 ENCOUNTER — Encounter: Payer: Self-pay | Admitting: Family Medicine

## 2020-08-16 ENCOUNTER — Encounter: Payer: Self-pay | Admitting: Family Medicine

## 2021-03-05 ENCOUNTER — Telehealth: Payer: Self-pay

## 2021-03-05 MED ORDER — MOLNUPIRAVIR EUA 200MG CAPSULE: 4.0000 | ORAL_CAPSULE | Freq: Two times a day (BID) | ORAL | 0 refills | Status: AC

## 2021-03-05 MED ORDER — BENZONATATE 200 MG PO CAPS: 200.0000 mg | ORAL_CAPSULE | Freq: Three times a day (TID) | ORAL | 1 refills | Status: DC | PRN

## 2021-03-05 NOTE — Telephone Encounter (Signed)
Andrew [name] has been diagnosed with Covid through rapid home test and PCR test.  First test was Sunday and then PCR yesterday afternoon.  Giving NyQuil and DayQuil for symptoms but today says chest really hurts and has loose cough but not getting much up.  Temp between 101 and 99.8.  I am asking if he should be on more like paxloid.   Thanks for your help. [name]   I spoke with Judy(DPR signed) symptoms started late Sat night with fatigue, fever,runnynose, sneezing, prod cough with clear phlegm,chills, body aches, severe Headache (pain level now is 5) pt taking tylenol and temp still 101; pt has had fever with tylenol on Sun, Mon and today. Today pt has generalized pain across chest when coughing; pts wife is not sure if sharp or dull CP. Pt has sinus drainage and now pt getting a sorethroat. Pt is eating and drinking. Pt lives about 1 1/2 hrs away from [facility]. On 02/27/21 pt was in closed truck with a pt that now is hospitalized due to covid. Pt has had moderna covid vaccines and one moderna booster. [name] wants to know if pt should be prescribed Paslovid. [name] said as a [employer] [place] the community they live in she would not take anyone to the UC there and would have to drive to [place] or [place] and pt does not feel like riding that far. Pt is resting right now and [name] has listened to pts lungs and thinks they are clear. [name] said if pt needed to go to ED she would take him but does not want to take him to ED unless necessary. No available video appts at [facility].[name] request cb after Dr Damita Dunnings reviews note. Sending note to DR Damita Dunnings and Janett Billow CMA.

## 2021-03-05 NOTE — Telephone Encounter (Addendum)
Fever is down to 99.  Cough is less today.  Rhinorrhea is better. ST persists.  Still with some cough.  Taking dayquil and nyquil for cough.  He feels some better today.  Sx started Saturday night, 03/02/21.    Discussed options, including oral antivirals.  We do not have a recent creatinine on patient but he should be able to take molnupiravir .  Routine cautions related that medication discussed with patient.  Emergency use authorization discussed with patient.  Reasonable for supportive care otherwise, rest and fluids, use Tessalon as needed for cough.  He will update me as needed.  He agrees with plan.  It does not appear that he needs emergency room evaluation and the whole point of treatment with molnupiravir is to decrease his risk of hospitalization and he understands that.  He will update me as needed.  He was scheduled to travel in the near future and if he needs a letter about canceling his trip I can do that.  He will let me know about that.

## 2021-03-06 ENCOUNTER — Encounter: Payer: Self-pay | Admitting: Family Medicine

## 2021-03-07 ENCOUNTER — Encounter: Payer: Self-pay | Admitting: Family Medicine

## 2021-03-08 ENCOUNTER — Encounter: Payer: Self-pay | Admitting: Family Medicine

## 2021-03-08 NOTE — Telephone Encounter (Signed)
Form has been printed and placed in folder for CMA to pick up

## 2021-03-11 ENCOUNTER — Encounter: Payer: Self-pay | Admitting: Family Medicine

## 2021-03-11 DIAGNOSIS — U071 COVID-19: Secondary | ICD-10-CM | POA: Insufficient documentation

## 2021-03-11 NOTE — Telephone Encounter (Signed)
This has been emailed to patient.

## 2021-04-28 ENCOUNTER — Other Ambulatory Visit: Payer: Self-pay | Admitting: Family Medicine

## 2021-04-28 DIAGNOSIS — Z125 Encounter for screening for malignant neoplasm of prostate: Secondary | ICD-10-CM

## 2021-04-28 DIAGNOSIS — R739 Hyperglycemia, unspecified: Secondary | ICD-10-CM

## 2021-04-28 DIAGNOSIS — E785 Hyperlipidemia, unspecified: Secondary | ICD-10-CM

## 2021-05-06 ENCOUNTER — Other Ambulatory Visit: Payer: Medicare Other

## 2021-05-08 ENCOUNTER — Other Ambulatory Visit: Payer: Self-pay

## 2021-05-08 ENCOUNTER — Other Ambulatory Visit (INDEPENDENT_AMBULATORY_CARE_PROVIDER_SITE_OTHER): Payer: Medicare Other

## 2021-05-08 DIAGNOSIS — Z125 Encounter for screening for malignant neoplasm of prostate: Secondary | ICD-10-CM | POA: Diagnosis not present

## 2021-05-08 DIAGNOSIS — E785 Hyperlipidemia, unspecified: Secondary | ICD-10-CM | POA: Diagnosis not present

## 2021-05-08 DIAGNOSIS — R739 Hyperglycemia, unspecified: Secondary | ICD-10-CM | POA: Diagnosis not present

## 2021-05-08 LAB — COMPREHENSIVE METABOLIC PANEL
ALT: 28 U/L (ref 0–53)
AST: 19 U/L (ref 0–37)
Albumin: 4.6 g/dL (ref 3.5–5.2)
Alkaline Phosphatase: 54 U/L (ref 39–117)
BUN: 18 mg/dL (ref 6–23)
CO2: 26 mEq/L (ref 19–32)
Calcium: 9.6 mg/dL (ref 8.4–10.5)
Chloride: 106 mEq/L (ref 96–112)
Creatinine, Ser: 0.86 mg/dL (ref 0.40–1.50)
GFR: 91.91 mL/min (ref >60.00)
Glucose, Bld: 105 mg/dL — ABNORMAL HIGH (ref 70–99)
Potassium: 4.9 mEq/L (ref 3.5–5.1)
Sodium: 141 mEq/L (ref 135–145)
Total Bilirubin: 0.6 mg/dL (ref 0.2–1.2)
Total Protein: 7.1 g/dL (ref 6.0–8.3)

## 2021-05-08 LAB — LIPID PANEL
Cholesterol: 171 mg/dL (ref 0–200)
HDL: 49.8 mg/dL (ref >39.00)
LDL Cholesterol: 106 mg/dL — ABNORMAL HIGH (ref 0–99)
NonHDL: 121.48
Total CHOL/HDL Ratio: 3
Triglycerides: 79 mg/dL (ref 0.0–149.0)
VLDL: 15.8 mg/dL (ref 0.0–40.0)

## 2021-05-08 LAB — HEMOGLOBIN A1C: Hgb A1c MFr Bld: 5.7 % (ref 4.6–6.5)

## 2021-05-08 LAB — PSA, MEDICARE: PSA: 0.24 ng/ml (ref 0.10–4.00)

## 2021-05-10 ENCOUNTER — Ambulatory Visit (INDEPENDENT_AMBULATORY_CARE_PROVIDER_SITE_OTHER): Payer: Medicare Other

## 2021-05-10 DIAGNOSIS — Z0000 Encounter for general adult medical examination without abnormal findings: Secondary | ICD-10-CM

## 2021-05-10 NOTE — Progress Notes (Addendum)
PCP notes:  Health Maintenance: No gaps noted   Abnormal Screenings: none   Patient concerns: none   Nurse concerns: none   Next PCP appt: 05/13/2021 @ 10:30 am

## 2021-05-10 NOTE — Progress Notes (Signed)
Subjective:   Andrew [name] is a 65 y.o. male who presents for Medicare Annual/Subsequent preventive examination.  Review of Systems: N/A      I connected with the patient today by telephone and verified that I am speaking with the correct person using two identifiers. Location patient: home Location nurse: work Persons participating in the telephone visit: patient, nurse.   I discussed the limitations, risks, security and privacy concerns of performing an evaluation and management service by telephone and the availability of in person appointments. I also discussed with the patient that there may be a patient responsible charge related to this service. The patient expressed understanding and verbally consented to this telephonic visit.        Cardiac Risk Factors include: advanced age (>51mn, >>29women);male gender;Other (see comment), Risk factor comments: hypercholesterolemia     Objective:    Today's Vitals   There is no height or weight on file to calculate BMI.  Advanced Directives 05/10/2021 05/09/2020 08/12/2019 04/13/2019 04/07/2018 03/26/2016 03/26/2016  Does Patient Have a Medical Advance Directive? Yes Yes Yes Yes Yes Yes Yes  Type of AParamedicof ACamasLiving will HAlhambraLiving will HDoughertyLiving will HVictoriaLiving will HHardyLiving will HElk RidgeLiving will HStaplehurstLiving will  Does patient want to make changes to medical advance directive? - - - - - Yes - information given Yes - information given  Copy of HNilandin Chart? Yes - validated most recent copy scanned in chart (See row information) No - copy requested - No - copy requested No - copy requested No - copy requested No - copy requested    Current Medications (verified) Outpatient Encounter Medications as of 05/10/2021  Medication Sig    acetaminophen (TYLENOL) 650 MG CR tablet Take 1,300 mg by mouth every 8 (eight) hours as needed for pain.   calcium carbonate (TUMS - DOSED IN MG ELEMENTAL CALCIUM) 500 MG chewable tablet Chew 1 tablet by mouth daily as needed for indigestion or heartburn.   celecoxib (CELEBREX) 100 MG capsule Take 1 capsule (100 mg total) by mouth 2 (two) times daily.   docusate sodium (COLACE) 50 MG capsule Take 50 mg by mouth 2 (two) times daily as needed for mild constipation.   Omega-3 Fatty Acids (FISH OIL) 1000 MG CAPS Take 1,000 mg by mouth daily.   pravastatin (PRAVACHOL) 20 MG tablet TAKE 1 TABLET BY MOUTH  DAILY   sildenafil (REVATIO) 20 MG tablet Take 3-5 tablets (60-100 mg total) by mouth daily as needed.   benzonatate (TESSALON) 200 MG capsule Take 1 capsule (200 mg total) by mouth 3 (three) times daily as needed.   No facility-administered encounter medications on file as of 05/10/2021.    Allergies (verified) Lyrica [pregabalin], Neurontin [gabapentin], Atorvastatin, Celebrex [celecoxib], Escitalopram oxalate, and Sertraline hcl   History: Past Medical History:  Diagnosis Date   Anxiety    many years ago but situational    Arthritis    Back pain    With spinal cord stimulator.  S/p lumbar fusion, diskectomy.  Prev seen by Dr. SVertell Limber   Complication of anesthesia    Constipation    uses Colace- he goes every 4-5 days- some hard stools    Diverticulitis    Diverticulosis    Erectile dysfunction    GERD (gastroesophageal reflux disease)    OCC    Hyperlipidemia  Leg paresthesia    L foot numb- since 1999 surgery   Neuromuscular disorder ([place])    siatica    PONV (postoperative nausea and vomiting)    Past Surgical History:  Procedure Laterality Date   ANTERIOR LAT LUMBAR FUSION Right 08/16/2019   Procedure: ANTERIOR LATERAL LUMBAR FUSION  RIGHT LUMBAR TWO- LUMBAR THREE WITH LATERAL PLATE;  Surgeon: Erline Levine, MD;  Location: [place];  Service: Neurosurgery;  Laterality: Right;   ANTERIOR LATERAL LUMBAR FUSION  RIGHT LUMBAR 2- LUMBAR 3 WITH LATERAL PLATE   APPENDECTOMY     BACK SURGERY     Lumbar back surgery L3/4 ans L5/S1 rods and screws L3-S1 (Dr Carloyn Manner) 02/29/2008   COLONOSCOPY     LAMINECTOMY      L4/5 1975,  L5/S1 1991   POLYPECTOMY     SHOULDER SURGERY Right    SPINAL CORD STIMULATOR IMPLANT     Family History  Problem Relation Age of Onset   Alzheimer's disease Mother    Dementia Mother    Heart disease Father    Hyperlipidemia Father    Dementia Father    Colon cancer Brother    Colon polyps Sister    Prostate cancer Neg Hx    Esophageal cancer Neg Hx    Rectal cancer Neg Hx    Stomach cancer Neg Hx    Social History   Socioeconomic History   Marital status: Married    Spouse name: Not on file   Number of children: 1   Years of education: Not on file   Highest education level: Not on file  Occupational History   Occupation: retired, because of back  Tobacco Use   Smoking status: Former   Smokeless tobacco: Former  Scientific laboratory technician Use: Never used  Substance and Sexual Activity   Alcohol use: Not Currently    Alcohol/week: 0.0 standard drinks   Drug use: No   Sexual activity: Yes  Other Topics Concern   Not on file  Social History Narrative   Disabled from back pain   Prev EMS/ambulance driver   Divorced, remarried 2015   Social Determinants of Health   Financial Resource Strain: Low Risk    Difficulty of Paying Living Expenses: Not hard at all  Food Insecurity: No Food Insecurity   Worried About Charity fundraiser in the Last Year: Never true   Arboriculturist in the Last Year: Never true  Transportation Needs: No Transportation Needs   Lack of Transportation (Medical): No   Lack of Transportation (Non-Medical): No  Physical Activity: Inactive   Days of Exercise per Week: 0 days   Minutes of Exercise per Session: 0 min  Stress: No Stress Concern Present   Feeling of Stress : Not at all  Social Connections: Not on  file    Tobacco Counseling Counseling given: Not Answered   Clinical Intake:  Pre-visit preparation completed: Yes  Pain : No/denies pain     Nutritional Risks: None Diabetes: No  How often do you need to have someone help you when you read instructions, pamphlets, or other written materials from your doctor or pharmacy?: 1 - Never  Diabetic: No Nutrition Risk Assessment:  Has the patient had any N/V/D within the last 2 months?  No  Does the patient have any non-healing wounds?  No  Has the patient had any unintentional weight loss or weight gain?  No   Diabetes:  Is the patient diabetic?  No  If diabetic, was a CBG obtained today?   N/A Did the patient bring in their glucometer from home?   N/A How often do you monitor your CBG's? N/A.   Financial Strains and Diabetes Management:  Are you having any financial strains with the device, your supplies or your medication?  N/A .  Does the patient want to be seen by Chronic Care Management for management of their diabetes?   N/A Would the patient like to be referred to a Nutritionist or for Diabetic Management?   N/A  Interpreter Needed?: No  Information entered by :: CJohnson, RN   Activities of Daily Living In your present state of health, do you have any difficulty performing the following activities: 05/10/2021  Hearing? N  Vision? N  Difficulty concentrating or making decisions? N  Walking or climbing stairs? N  Dressing or bathing? N  Doing errands, shopping? N  Preparing Food and eating ? N  Using the Toilet? N  In the past six months, have you accidently leaked urine? N  Do you have problems with loss of bowel control? N  Managing your Medications? N  Managing your Finances? N  Housekeeping or managing your Housekeeping? N  Some recent data might be hidden    Patient Care Team: Tonia Ghent, MD as PCP - General Justice Britain, MD as Consulting Physician (Orthopedic Surgery) Erline Levine, MD as  Consulting Physician (Neurosurgery)  Indicate any recent Medical Services you may have received from other than [facility] providers in the past year (date may be approximate).     Assessment:   This is a routine wellness examination for Tice.  Hearing/Vision screen Vision Screening - Comments:: Patient gets annual eye exams   Dietary issues and exercise activities discussed: Current Exercise Habits: The patient does not participate in regular exercise at present, Exercise limited by: None identified   Goals Addressed             This Visit's Progress    Patient Stated       05/10/2021, I will maintain and continue medications as prescribed.       Depression Screen PHQ 2/9 Scores 05/10/2021 05/09/2020 04/13/2019 04/07/2018 06/03/2017 03/26/2016 03/26/2016  PHQ - 2 Score 0 0 0 0 0 0 0  PHQ- 9 Score 0 0 0 0 - - -  Exception Documentation - - Medical reason - - - -    Fall Risk Fall Risk  05/10/2021 05/09/2020 04/13/2019 04/07/2018 06/03/2017  Falls in the past year? 0 1 0 No No  Comment - tripped on sidewalk - - -  Number falls in past yr: 0 0 - - -  Injury with Fall? 0 1 - - -  Comment - broke wrist - - -  Risk for fall due to : Medication side effect Medication side effect - - -  Follow up Falls evaluation completed;Falls prevention discussed Falls evaluation completed;Falls prevention discussed - - -    FALL RISK PREVENTION PERTAINING TO THE HOME:  Any stairs in or around the home? Yes  If so, are there any without handrails? No  Home free of loose throw rugs in walkways, pet beds, electrical cords, etc? Yes  Adequate lighting in your home to reduce risk of falls? Yes   ASSISTIVE DEVICES UTILIZED TO PREVENT FALLS:  Life alert? No  Use of a cane, walker or w/c? No  Grab bars in the bathroom? No  Shower chair or bench in shower? No  Elevated toilet seat or a  handicapped toilet? No   TIMED UP AND GO:  Was the test performed?  N/A telephone visit .    Cognitive  Function: MMSE - Mini Mental State Exam 05/10/2021 05/09/2020 04/13/2019 04/07/2018 03/26/2016  Orientation to time '5 5 5 5 5  '$ Orientation to Place '5 5 5 5 5  '$ Registration '3 3 3 3 3  '$ Attention/ Calculation 5 5 0 0 0  Recall '3 3 3 3 3  '$ Language- name 2 objects - - 0 0 0  Language- repeat '1 1 1 1 1  '$ Language- follow 3 step command - - 0 3 3  Language- read & follow direction - - 0 0 0  Write a sentence - - 0 0 0  Copy design - - 0 0 0  Total score - - '17 20 20  '$ Mini Cog  Mini-Cog screen was completed. Maximum score is 22. A value of 0 denotes this part of the MMSE was not completed or the patient failed this part of the Mini-Cog screening.       Immunizations Immunization History  Administered Date(s) Administered   Influenza Whole 07/20/2013   Influenza,inj,Quad PF,6+ Mos 07/13/2017, 07/26/2018, 07/21/2019, 07/24/2020   Influenza-Unspecified 07/13/2017, 04/25/2018, 07/26/2018, 07/21/2019   Moderna Sars-Covid-2 Vaccination 11/23/2019, 12/22/2019, 08/16/2020   Td 01/02/2003   Tdap 05/27/2013   Zoster Recombinat (Shingrix) 07/24/2020, 09/23/2020    TDAP status: Up to date  Flu Vaccine status: Up to date  Pneumococcal vaccine status: due at age 69   Covid-19 vaccine status: Completed 3 vaccines  Qualifies for Shingles Vaccine? Yes   Zostavax completed No   Shingrix Completed?: Yes  Screening Tests Health Maintenance  Topic Date Due   COVID-19 Vaccine (4 - Booster for Moderna series) 12/17/2020   INFLUENZA VACCINE  05/20/2021   TETANUS/TDAP  05/28/2023   COLONOSCOPY (Pts 45-40yr Insurance coverage will need to be confirmed)  01/23/2025   Hepatitis C Screening  Completed   HIV Screening  Completed   Zoster Vaccines- Shingrix  Completed   Pneumococcal Vaccine [id number] old  Aged Out   HPV VPoint of RocksMaintenance Due  Topic Date Due   COVID-19 Vaccine (4 - Booster for Moderna series) 12/17/2020    Colorectal cancer screening:  Type of screening: Colonoscopy. Completed 01/24/2020. Repeat every 5 years  Lung Cancer Screening: (Low Dose CT Chest recommended if Age 64-80years, 30 pack-year currently smoking OR have quit w/in 15years.) does not qualify.   Additional Screening:  Hepatitis C Screening: does qualify; Completed 10/02/2015  Vision Screening: Recommended annual ophthalmology exams for early detection of glaucoma and other disorders of the eye. Is the patient up to date with their annual eye exam?  Yes  Who is the provider or what is the name of the office in which the patient attends annual eye exams[facility] not [facility] with a provider, would they like to be referred to a provider to establish care? No .   Dental Screening: Recommended annual dental exams for proper oral hygiene  Community Resource Referral / Chronic Care Management: CRR required this visit?  No   CCM required this visit?  No      Plan:     I have personally reviewed and noted the following in the patient's chart:   Medical and social history Use of alcohol, tobacco or illicit drugs  Current medications and supplements including opioid prescriptions. Patient is not currently  taking opioid prescriptions. Functional ability and status Nutritional status Physical activity Advanced directives List of other physicians Hospitalizations, surgeries, and ER visits in previous 12 months Vitals Screenings to include cognitive, depression, and falls Referrals and appointments  In addition, I have reviewed and discussed with patient certain preventive protocols, quality metrics, and best practice recommendations. A written personalized care plan for preventive services as well as general preventive health recommendations were provided to patient.   Due to this being a telephonic visit, the after visit summary with patients personalized plan was offered to patient via office or my-chart. Patient preferred to  pick up at office at next visit or via mychart.   Andrez Grime, LPN   [id number]

## 2021-05-10 NOTE — Patient Instructions (Signed)
Mr. Andrew [name] , Thank you for taking time to come for your Medicare Wellness Visit. I appreciate your ongoing commitment to your health goals. Please review the following plan we discussed and let me know if I can assist you in the future.   Screening recommendations/referrals: Colonoscopy: Up to date, completed 01/24/2020, due 01/2025 Recommended yearly ophthalmology/optometry visit for glaucoma screening and checkup Recommended yearly dental visit for hygiene and checkup  Vaccinations: Influenza vaccine: Up to date, completed 07/24/2020, due 05/2021 Pneumococcal vaccine: due at age 64  Tdap vaccine: Up to date, completed 05/27/2013, due 05/2023 Shingles vaccine: Completed series   Covid-19: completed 3 vaccines   Advanced directives: copy in chart   Conditions/risks identified: hypercholesterolemia   Next appointment: Follow up in one year for your annual wellness visit.   Preventive Care 22-25 Years Old, Male Preventive care refers to lifestyle choices and visits with your health care provider that can promote health and wellness. What does preventive care include? A yearly physical exam. This is also called an annual well check. Dental exams once or twice a year. Routine eye exams. Ask your health care provider how often you should have your eyes checked. Personal lifestyle choices, including: Daily care of your teeth and gums. Regular physical activity. Eating a healthy diet. Avoiding tobacco and drug use. Limiting alcohol use. Practicing safe sex. Taking low doses of aspirin every day. Taking vitamin and mineral supplements as recommended by your health care provider. What happens during an annual well check? The services and screenings done by your health care provider during your annual well check will depend on your age, overall health, lifestyle risk factors, and family history of disease. Counseling  Your health care provider may ask you questions about your: Alcohol  use. Tobacco use. Drug use. Emotional well-being. Home and relationship well-being. Sexual activity. Eating habits. History of falls. Memory and ability to understand (cognition). Work and work Statistician. Screening  You may have the following tests or measurements: Height, weight, and BMI. Blood pressure. Lipid and cholesterol levels. These may be checked every 5 years, or more frequently if you are over 26 years old. Skin check. Lung cancer screening. You may have this screening every year starting at age 66 if you have a 30-pack-year history of smoking and currently smoke or have quit within the past 15 years. Fecal occult blood test (FOBT) of the stool. You may have this test every year starting at age 13. Flexible sigmoidoscopy or colonoscopy. You may have a sigmoidoscopy every 5 years or a colonoscopy every 10 years starting at age 49. Prostate cancer screening. Recommendations will vary depending on your family history and other risks. Hepatitis C blood test. Hepatitis B blood test. Sexually transmitted disease (STD) testing. Diabetes screening. This is done by checking your blood sugar (glucose) after you have not eaten for a while (fasting). You may have this done every 1-3 years. Abdominal aortic aneurysm (AAA) screening. You may need this if you are a current or former smoker. Osteoporosis. You may be screened starting at age 65 if you are at high risk. Talk with your health care provider about your test results, treatment options, and if necessary, the need for more tests. Vaccines  Your health care provider may recommend certain vaccines, such as: Influenza vaccine. This is recommended every year. Tetanus, diphtheria, and acellular pertussis (Tdap, Td) vaccine. You may need a Td booster every 10 years. Zoster vaccine. You may need this after age 75. Pneumococcal 13-valent conjugate (PCV13) vaccine. One  dose is recommended after age 42. Pneumococcal polysaccharide  (PPSV23) vaccine. One dose is recommended after age 40. Talk to your health care provider about which screenings and vaccines you need and how often you need them. This information is not intended to replace advice given to you by your health care provider. Make sure you discuss any questions you have with your health care provider. Document Released: 11/02/2015 Document Revised: 06/25/2016 Document Reviewed: 08/07/2015 Elsevier Interactive Patient Education  2017 [place] Prevention in the Home Falls can cause injuries. They can happen to people of all ages. There are many things you can do to make your home safe and to help prevent falls. What can I do on the outside of my home? Regularly fix the edges of walkways and driveways and fix any cracks. Remove anything that might make you trip as you walk through a door, such as a raised step or threshold. Trim any bushes or trees on the path to your home. Use bright outdoor lighting. Clear any walking paths of anything that might make someone trip, such as rocks or tools. Regularly check to see if handrails are loose or broken. Make sure that both sides of any steps have handrails. Any raised decks and porches should have guardrails on the edges. Have any leaves, snow, or ice cleared regularly. Use sand or salt on walking paths during winter. Clean up any spills in your garage right away. This includes oil or grease spills. What can I do in the bathroom? Use night lights. Install grab bars by the toilet and in the tub and shower. Do not use towel bars as grab bars. Use non-skid mats or decals in the tub or shower. If you need to sit down in the shower, use a plastic, non-slip stool. Keep the floor dry. Clean up any water that spills on the floor as soon as it happens. Remove soap buildup in the tub or shower regularly. Attach bath mats securely with double-sided non-slip rug tape. Do not have throw rugs and other things on the  floor that can make you trip. What can I do in the bedroom? Use night lights. Make sure that you have a light by your bed that is easy to reach. Do not use any sheets or blankets that are too big for your bed. They should not hang down onto the floor. Have a firm chair that has side arms. You can use this for support while you get dressed. Do not have throw rugs and other things on the floor that can make you trip. What can I do in the kitchen? Clean up any spills right away. Avoid walking on wet floors. Keep items that you use a lot in easy-to-reach places. If you need to reach something above you, use a strong step stool that has a grab bar. Keep electrical cords out of the way. Do not use floor polish or wax that makes floors slippery. If you must use wax, use non-skid floor wax. Do not have throw rugs and other things on the floor that can make you trip. What can I do with my stairs? Do not leave any items on the stairs. Make sure that there are handrails on both sides of the stairs and use them. Fix handrails that are broken or loose. Make sure that handrails are as long as the stairways. Check any carpeting to make sure that it is firmly attached to the stairs. Fix any carpet that is loose or worn.  Avoid having throw rugs at the top or bottom of the stairs. If you do have throw rugs, attach them to the floor with carpet tape. Make sure that you have a light switch at the top of the stairs and the bottom of the stairs. If you do not have them, ask someone to add them for you. What else can I do to help prevent falls? Wear shoes that: Do not have high heels. Have rubber bottoms. Are comfortable and fit you well. Are closed at the toe. Do not wear sandals. If you use a stepladder: Make sure that it is fully opened. Do not climb a closed stepladder. Make sure that both sides of the stepladder are locked into place. Ask someone to hold it for you, if possible. Clearly mark and make  sure that you can see: Any grab bars or handrails. First and last steps. Where the edge of each step is. Use tools that help you move around (mobility aids) if they are needed. These include: Canes. Walkers. Scooters. Crutches. Turn on the lights when you go into a dark area. Replace any light bulbs as soon as they burn out. Set up your furniture so you have a clear path. Avoid moving your furniture around. If any of your floors are uneven, fix them. If there are any pets around you, be aware of where they are. Review your medicines with your doctor. Some medicines can make you feel dizzy. This can increase your chance of falling. Ask your doctor what other things that you can do to help prevent falls. This information is not intended to replace advice given to you by your health care provider. Make sure you discuss any questions you have with your health care provider. Document Released: 08/02/2009 Document Revised: 03/13/2016 Document Reviewed: 11/10/2014 Elsevier Interactive Patient Education  2017 [employer].

## 2021-05-13 ENCOUNTER — Encounter: Payer: Self-pay | Admitting: Family Medicine

## 2021-05-13 ENCOUNTER — Ambulatory Visit (INDEPENDENT_AMBULATORY_CARE_PROVIDER_SITE_OTHER): Payer: Medicare Other | Admitting: Family Medicine

## 2021-05-13 ENCOUNTER — Other Ambulatory Visit: Payer: Self-pay

## 2021-05-13 VITALS — BP 128/74 | HR 75 | Temp 97.6°F | Ht 76.0 in | Wt 216.0 lb

## 2021-05-13 DIAGNOSIS — G8929 Other chronic pain: Secondary | ICD-10-CM | POA: Diagnosis not present

## 2021-05-13 DIAGNOSIS — M545 Low back pain, unspecified: Secondary | ICD-10-CM | POA: Diagnosis not present

## 2021-05-13 DIAGNOSIS — Z7189 Other specified counseling: Secondary | ICD-10-CM

## 2021-05-13 DIAGNOSIS — E7800 Pure hypercholesterolemia, unspecified: Secondary | ICD-10-CM

## 2021-05-13 DIAGNOSIS — Z0000 Encounter for general adult medical examination without abnormal findings: Secondary | ICD-10-CM

## 2021-05-13 MED ORDER — CELECOXIB 100 MG PO CAPS: 100.0000 mg | ORAL_CAPSULE | Freq: Two times a day (BID) | ORAL | 3 refills | Status: DC

## 2021-05-13 MED ORDER — PRAVASTATIN SODIUM 20 MG PO TABS: 20.0000 mg | ORAL_TABLET | Freq: Every day | ORAL | 3 refills | Status: DC

## 2021-05-13 NOTE — Patient Instructions (Signed)
Don't change your meds for now.  Update me as needed.  Thanks for your effort.  Take care.  Glad to see you.

## 2021-05-13 NOTE — Progress Notes (Signed)
This visit occurred during the SARS-CoV-2 public health emergency.  Safety protocols were in place, including screening questions prior to the visit, additional usage of staff PPE, and extensive cleaning of exam room while observing appropriate contact time as indicated for disinfecting solutions.  Elevated Cholesterol: Using medications without problems: yes Muscle aches: not from statin Diet compliance: yes Exercise: yes  Back pain at baseline.  This is manageable after prev surgery, he improved after surgery.  Taking celebrex '100mg'$  BID.  That helped more than '200mg'$  QD.  No ADE on med.    Tetanus 2014 PNA not due Shingrix done Flu shot done yearly.   covid 2021 PSA wnl 2022 Colon 2021 Living will d/w pt.  Wife [name] designated if patient were incapacitated.    He got over covid.  D/w pt.    Meds, vitals, and allergies reviewed.   ROS: Per HPI unless specifically indicated in ROS section   GEN: nad, alert and oriented HEENT: ncat NECK: supple w/o LA CV: rrr. PULM: ctab, no inc wob ABD: soft, +bs EXT: no edema SKIN: no acute rash  33 minutes were devoted to patient care in this encounter (this includes time spent reviewing the patient's file/history, interviewing and examining the patient, counseling/reviewing plan with patient).

## 2021-05-15 NOTE — Assessment & Plan Note (Signed)
Tetanus 2014 PNA not due Shingrix done Flu shot done yearly.   covid 2021 PSA wnl 2022 Colon 2021 Living will d/w pt.  Wife [name] designated if patient were incapacitated.

## 2021-05-15 NOTE — Assessment & Plan Note (Signed)
Continue pravastatin.  Continue work on diet and exercise as tolerated. Labs d/w pt.  He agrees. With plan.

## 2021-05-15 NOTE — Assessment & Plan Note (Signed)
Reasonable continue with Celebrex 100 mg twice daily.  He will update me as needed.  Fortunately improved after previous back surgery.

## 2021-05-15 NOTE — Assessment & Plan Note (Signed)
Living will d/w pt.  Wife Judy designated if patient were incapacitated.   

## 2021-08-06 ENCOUNTER — Encounter: Payer: Self-pay | Admitting: Family Medicine

## 2022-04-19 ENCOUNTER — Other Ambulatory Visit: Payer: Self-pay | Admitting: Family Medicine

## 2022-04-23 ENCOUNTER — Encounter: Payer: Self-pay | Admitting: Family Medicine

## 2022-04-28 ENCOUNTER — Telehealth: Payer: Self-pay | Admitting: Family Medicine

## 2022-04-29 NOTE — Telephone Encounter (Signed)
Err

## 2022-05-04 ENCOUNTER — Other Ambulatory Visit: Payer: Self-pay | Admitting: Family Medicine

## 2022-05-04 DIAGNOSIS — R739 Hyperglycemia, unspecified: Secondary | ICD-10-CM

## 2022-05-04 DIAGNOSIS — E7800 Pure hypercholesterolemia, unspecified: Secondary | ICD-10-CM

## 2022-05-04 DIAGNOSIS — Z125 Encounter for screening for malignant neoplasm of prostate: Secondary | ICD-10-CM

## 2022-05-07 ENCOUNTER — Other Ambulatory Visit (INDEPENDENT_AMBULATORY_CARE_PROVIDER_SITE_OTHER): Payer: Medicare Other

## 2022-05-07 DIAGNOSIS — Z125 Encounter for screening for malignant neoplasm of prostate: Secondary | ICD-10-CM | POA: Diagnosis not present

## 2022-05-07 DIAGNOSIS — R739 Hyperglycemia, unspecified: Secondary | ICD-10-CM | POA: Diagnosis not present

## 2022-05-07 DIAGNOSIS — E7800 Pure hypercholesterolemia, unspecified: Secondary | ICD-10-CM | POA: Diagnosis not present

## 2022-05-07 LAB — COMPREHENSIVE METABOLIC PANEL
ALT: 31 U/L (ref 0–53)
AST: 20 U/L (ref 0–37)
Albumin: 4.9 g/dL (ref 3.5–5.2)
Alkaline Phosphatase: 59 U/L (ref 39–117)
BUN: 16 mg/dL (ref 6–23)
CO2: 27 mEq/L (ref 19–32)
Calcium: 9.4 mg/dL (ref 8.4–10.5)
Chloride: 106 mEq/L (ref 96–112)
Creatinine, Ser: 0.82 mg/dL (ref 0.40–1.50)
GFR: 92.59 mL/min (ref >60.00)
Glucose, Bld: 106 mg/dL — ABNORMAL HIGH (ref 70–99)
Potassium: 4.9 mEq/L (ref 3.5–5.1)
Sodium: 140 mEq/L (ref 135–145)
Total Bilirubin: 0.6 mg/dL (ref 0.2–1.2)
Total Protein: 7.2 g/dL (ref 6.0–8.3)

## 2022-05-07 LAB — LIPID PANEL
Cholesterol: 164 mg/dL (ref 0–200)
HDL: 43.9 mg/dL (ref >39.00)
LDL Cholesterol: 101 mg/dL — ABNORMAL HIGH (ref 0–99)
NonHDL: 119.61
Total CHOL/HDL Ratio: 4
Triglycerides: 93 mg/dL (ref 0.0–149.0)
VLDL: 18.6 mg/dL (ref 0.0–40.0)

## 2022-05-07 LAB — PSA, MEDICARE: PSA: 0.2 ng/ml (ref 0.10–4.00)

## 2022-05-07 LAB — HEMOGLOBIN A1C: Hgb A1c MFr Bld: 5.9 % (ref 4.6–6.5)

## 2022-05-14 ENCOUNTER — Ambulatory Visit (INDEPENDENT_AMBULATORY_CARE_PROVIDER_SITE_OTHER): Payer: Medicare Other

## 2022-05-14 VITALS — Wt 216.0 lb

## 2022-05-14 DIAGNOSIS — Z0000 Encounter for general adult medical examination without abnormal findings: Secondary | ICD-10-CM

## 2022-05-14 NOTE — Patient Instructions (Signed)
Mr. Andrew [name] , Thank you for taking time to come for your Medicare Wellness Visit. I appreciate your ongoing commitment to your health goals. Please review the following plan we discussed and let me know if I can assist you in the future.   Screening recommendations/referrals: Colonoscopy: 01/24/20 Recommended yearly ophthalmology/optometry visit for glaucoma screening and checkup Recommended yearly dental visit for hygiene and checkup  Vaccinations: Influenza vaccine: 08/02/21 Pneumococcal vaccine: n/d Tdap vaccine: 05/27/13 Shingles vaccine: Shingrix 07/24/20, 09/23/20   Covid-19: 11/23/19, 12/22/19, 08/16/20  Advanced directives: yes  Conditions/risks identified: none  Next appointment: Follow up in one year for your annual wellness visit. 05/18/23 @ 2:15 pm by phone  Preventive Care 65 Years and Older, Male Preventive care refers to lifestyle choices and visits with your health care provider that can promote health and wellness. What does preventive care include? A yearly physical exam. This is also called an annual well check. Dental exams once or twice a year. Routine eye exams. Ask your health care provider how often you should have your eyes checked. Personal lifestyle choices, including: Daily care of your teeth and gums. Regular physical activity. Eating a healthy diet. Avoiding tobacco and drug use. Limiting alcohol use. Practicing safe sex. Taking low doses of aspirin every day. Taking vitamin and mineral supplements as recommended by your health care provider. What happens during an annual well check? The services and screenings done by your health care provider during your annual well check will depend on your age, overall health, lifestyle risk factors, and family history of disease. Counseling  Your health care provider may ask you questions about your: Alcohol use. Tobacco use. Drug use. Emotional well-being. Home and relationship well-being. Sexual activity. Eating  habits. History of falls. Memory and ability to understand (cognition). Work and work Statistician. Screening  You may have the following tests or measurements: Height, weight, and BMI. Blood pressure. Lipid and cholesterol levels. These may be checked every 5 years, or more frequently if you are over 51 years old. Skin check. Lung cancer screening. You may have this screening every year starting at age 75 if you have a 30-pack-year history of smoking and currently smoke or have quit within the past 15 years. Fecal occult blood test (FOBT) of the stool. You may have this test every year starting at age 55. Flexible sigmoidoscopy or colonoscopy. You may have a sigmoidoscopy every 5 years or a colonoscopy every 10 years starting at age 32. Prostate cancer screening. Recommendations will vary depending on your family history and other risks. Hepatitis C blood test. Hepatitis B blood test. Sexually transmitted disease (STD) testing. Diabetes screening. This is done by checking your blood sugar (glucose) after you have not eaten for a while (fasting). You may have this done every 1-3 years. Abdominal aortic aneurysm (AAA) screening. You may need this if you are a current or former smoker. Osteoporosis. You may be screened starting at age 69 if you are at high risk. Talk with your health care provider about your test results, treatment options, and if necessary, the need for more tests. Vaccines  Your health care provider may recommend certain vaccines, such as: Influenza vaccine. This is recommended every year. Tetanus, diphtheria, and acellular pertussis (Tdap, Td) vaccine. You may need a Td booster every 10 years. Zoster vaccine. You may need this after age 33. Pneumococcal 13-valent conjugate (PCV13) vaccine. One dose is recommended after age 31. Pneumococcal polysaccharide (PPSV23) vaccine. One dose is recommended after age 78. Talk to your  health care provider about which screenings and  vaccines you need and how often you need them. This information is not intended to replace advice given to you by your health care provider. Make sure you discuss any questions you have with your health care provider. Document Released: 11/02/2015 Document Revised: 06/25/2016 Document Reviewed: 08/07/2015 Elsevier Interactive Patient Education  2017 [employer] Prevention in the Home Falls can cause injuries. They can happen to people of all ages. There are many things you can do to make your home safe and to help prevent falls. What can I do on the outside of my home? Regularly fix the edges of walkways and driveways and fix any cracks. Remove anything that might make you trip as you walk through a door, such as a raised step or threshold. Trim any bushes or trees on the path to your home. Use bright outdoor lighting. Clear any walking paths of anything that might make someone trip, such as rocks or tools. Regularly check to see if handrails are loose or broken. Make sure that both sides of any steps have handrails. Any raised decks and porches should have guardrails on the edges. Have any leaves, snow, or ice cleared regularly. Use sand or salt on walking paths during winter. Clean up any spills in your garage right away. This includes oil or grease spills. What can I do in the bathroom? Use night lights. Install grab bars by the toilet and in the tub and shower. Do not use towel bars as grab bars. Use non-skid mats or decals in the tub or shower. If you need to sit down in the shower, use a plastic, non-slip stool. Keep the floor dry. Clean up any water that spills on the floor as soon as it happens. Remove soap buildup in the tub or shower regularly. Attach bath mats securely with double-sided non-slip rug tape. Do not have throw rugs and other things on the floor that can make you trip. What can I do in the bedroom? Use night lights. Make sure that you have a light by your  bed that is easy to reach. Do not use any sheets or blankets that are too big for your bed. They should not hang down onto the floor. Have a firm chair that has side arms. You can use this for support while you get dressed. Do not have throw rugs and other things on the floor that can make you trip. What can I do in the kitchen? Clean up any spills right away. Avoid walking on wet floors. Keep items that you use a lot in easy-to-reach places. If you need to reach something above you, use a strong step stool that has a grab bar. Keep electrical cords out of the way. Do not use floor polish or wax that makes floors slippery. If you must use wax, use non-skid floor wax. Do not have throw rugs and other things on the floor that can make you trip. What can I do with my stairs? Do not leave any items on the stairs. Make sure that there are handrails on both sides of the stairs and use them. Fix handrails that are broken or loose. Make sure that handrails are as long as the stairways. Check any carpeting to make sure that it is firmly attached to the stairs. Fix any carpet that is loose or worn. Avoid having throw rugs at the top or bottom of the stairs. If you do have throw rugs, attach them  to the floor with carpet tape. Make sure that you have a light switch at the top of the stairs and the bottom of the stairs. If you do not have them, ask someone to add them for you. What else can I do to help prevent falls? Wear shoes that: Do not have high heels. Have rubber bottoms. Are comfortable and fit you well. Are closed at the toe. Do not wear sandals. If you use a stepladder: Make sure that it is fully opened. Do not climb a closed stepladder. Make sure that both sides of the stepladder are locked into place. Ask someone to hold it for you, if possible. Clearly mark and make sure that you can see: Any grab bars or handrails. First and last steps. Where the edge of each step is. Use tools that  help you move around (mobility aids) if they are needed. These include: Canes. Walkers. Scooters. Crutches. Turn on the lights when you go into a dark area. Replace any light bulbs as soon as they burn out. Set up your furniture so you have a clear path. Avoid moving your furniture around. If any of your floors are uneven, fix them. If there are any pets around you, be aware of where they are. Review your medicines with your doctor. Some medicines can make you feel dizzy. This can increase your chance of falling. Ask your doctor what other things that you can do to help prevent falls. This information is not intended to replace advice given to you by your health care provider. Make sure you discuss any questions you have with your health care provider. Document Released: 08/02/2009 Document Revised: 03/13/2016 Document Reviewed: 11/10/2014 Elsevier Interactive Patient Education  2017 [employer].

## 2022-05-14 NOTE — Progress Notes (Signed)
Virtual Visit via Telephone Note  I connected with  Andrew [name] on 05/14/22 at  1:00 PM EDT by telephone and verified that I am speaking with the correct person using two identifiers.  Location: Patient: home Provider: [place] Persons participating in the virtual visit: [place]   I discussed the limitations, risks, security and privacy concerns of performing an evaluation and management service by telephone and the availability of in person appointments. The patient expressed understanding and agreed to proceed.  Interactive audio and video telecommunications were attempted between this nurse and patient, however failed, due to patient having technical difficulties OR patient did not have access to video capability.  We continued and completed visit with audio only.  Some vital signs may be absent or patient reported.   Andrew David, LPN  Subjective:   Andrew [name] is a 66 y.o. male who presents for Medicare Annual/Subsequent preventive examination.  Review of Systems     Cardiac Risk Factors include: advanced age (>74mn, >>63women);male gender;dyslipidemia     Objective:    There were no vitals filed for this visit. There is no height or weight on file to calculate BMI.     05/14/2022    1:15 PM 05/10/2021   10:41 AM 05/09/2020    2:08 PM 08/12/2019   10:20 AM 04/13/2019   12:12 PM 04/07/2018    2:51 PM 03/26/2016    2:16 PM  Advanced Directives  Does Patient Have a Medical Advance Directive? Yes Yes Yes Yes Yes Yes Yes  Type of AParamedicof ALive OakLiving will HDuck[place]Living will HGothaLiving will HArcolaLiving will HGirdletreeLiving will HWinonaLiving will HTyndallLiving will  Does patient want to make changes to medical advance directive? Yes (Inpatient - patient defers changing a medical  advance directive and declines information at this time)      Yes - information given  Copy of HM[place]in Chart? Yes - validated most recent copy scanned in chart (See row information) Yes - validated most recent copy scanned in chart (See row information) No - copy requested  No - copy requested No - copy requested No - copy requested    Current Medications (verified) Outpatient Encounter Medications as of 05/14/2022  Medication Sig   acetaminophen (TYLENOL) 650 MG CR tablet Take 1,300 mg by mouth every 8 (eight) hours as needed for pain.   calcium carbonate (TUMS - DOSED IN MG ELEMENTAL CALCIUM) 500 MG chewable tablet Chew 1 tablet by mouth daily as needed for indigestion or heartburn.   celecoxib (CELEBREX) 100 MG capsule TAKE 1 CAPSULE BY MOUTH  TWICE DAILY   docusate sodium (COLACE) 50 MG capsule Take 50 mg by mouth 2 (two) times daily as needed for mild constipation.   Omega-3 Fatty Acids (FISH OIL) 1000 MG CAPS Take 1,000 mg by mouth daily.   pravastatin (PRAVACHOL) 20 MG tablet TAKE 1 TABLET BY MOUTH  DAILY   ketorolac (TORADOL) 10 MG tablet Take 10 mg by mouth every 6 (six) hours as needed. (Patient not taking: Reported on 05/14/2022)   [EXPIRED] lidocaine (LIDODERM) 5 % Apply topically.   methocarbamol (ROBAXIN) 500 MG tablet Take 500 mg by mouth 3 (three) times daily. (Patient not taking: Reported on 05/14/2022)   No facility-administered encounter medications on file as of 05/14/2022.    Allergies (verified) Lyrica [pregabalin], Neurontin [gabapentin], Escitalopram  oxalate, and Sertraline hcl   History: Past Medical History:  Diagnosis Date   Anxiety    many years ago but situational    Arthritis    Back pain    With spinal cord stimulator.  S/p lumbar fusion, diskectomy.  Prev seen by Dr. Vertell Limber    Complication of anesthesia    Constipation    uses Colace- he goes every 4-5 days- some hard stools    Diverticulitis    Diverticulosis    Erectile  dysfunction    GERD (gastroesophageal reflux disease)    OCC    Hyperlipidemia    Leg paresthesia    L foot numb- since 1999 surgery   Neuromuscular disorder ([place])    siatica    PONV (postoperative nausea and vomiting)    Past Surgical History:  Procedure Laterality Date   ANTERIOR LAT LUMBAR FUSION Right 08/16/2019   Procedure: ANTERIOR LATERAL LUMBAR FUSION  RIGHT LUMBAR TWO- LUMBAR THREE WITH LATERAL PLATE;  Surgeon: Erline Levine, MD;  Location: [facility];  Service: Neurosurgery;  Laterality: Right;  ANTERIOR LATERAL LUMBAR FUSION  RIGHT LUMBAR 2- LUMBAR 3 WITH LATERAL PLATE   APPENDECTOMY     BACK SURGERY     Lumbar back surgery L3/4 ans L5/S1 rods and screws L3-S1 (Dr Carloyn Manner) 02/29/2008   COLONOSCOPY     LAMINECTOMY      L4/5 1975,  L5/S1 1991   POLYPECTOMY     SHOULDER SURGERY Right    SPINAL CORD STIMULATOR IMPLANT     Family History  Problem Relation Age of Onset   Alzheimer's disease Mother    Dementia Mother    Heart disease Father    Hyperlipidemia Father    Dementia Father    Colon cancer Brother    Colon polyps Sister    Prostate cancer Neg Hx    Esophageal cancer Neg Hx    Rectal cancer Neg Hx    Stomach cancer Neg Hx    Social History   Socioeconomic History   Marital status: Married    Spouse name: Not on file   Number of children: 1   Years of education: Not on file   Highest education level: Not on file  Occupational History   Occupation: retired, because of back  Tobacco Use   Smoking status: Former   Smokeless tobacco: Former  Scientific laboratory technician Use: Never used  Substance and Sexual Activity   Alcohol use: Not Currently    Alcohol/week: 0.0 standard drinks of alcohol   Drug use: No   Sexual activity: Yes  Other Topics Concern   Not on file  Social History Narrative   Disabled from back pain   Prev EMS/ambulance driver   Divorced, remarried 2015   Social Determinants of Health   Financial Resource Strain: Low Risk  (05/14/2022)   Overall  Financial Resource Strain (CARDIA)    Difficulty of Paying Living Expenses: Not hard at all  Food Insecurity: No Food Insecurity (05/14/2022)   Hunger Vital Sign    Worried About Running Out of Food in the Last Year: Never true    Ran Out of Food in the Last Year: Never true  Transportation Needs: No Transportation Needs (05/14/2022)   PRAPARE - Hydrologist (Medical): No    Lack of Transportation (Non-Medical): No  Physical Activity: Insufficiently Active (05/14/2022)   Exercise Vital Sign    Days of Exercise per Week: 3 days    Minutes of  Exercise per Session: 30 min  Stress: No Stress Concern Present (05/14/2022)   [place]    Feeling of Stress : Not at all  Social Connections: Moderately Integrated (05/14/2022)   Social Connection and Isolation Panel [NHANES]    Frequency of Communication with Friends and Family: More than three times a week    Frequency of Social Gatherings with Friends and Family: Once a week    Attends Religious Services: More than 4 times per year    Active Member of [employer] or Organizations: No    Attends Music therapist: Never    Marital Status: Married    Tobacco Counseling Counseling given: Not Answered   Clinical Intake:  Pre-visit preparation completed: Yes  Pain : No/denies pain     Diabetes: No  How often do you need to have someone help you when you read instructions, pamphlets, or other written materials from your doctor or pharmacy?: 1 - Never  Diabetic?no  Interpreter Needed?: No  Information entered by :: Kirke Shaggy, LPN   Activities of Daily Living    05/14/2022    1:18 PM  In your present state of health, do you have any difficulty performing the following activities:  Hearing? 1  Vision? 0  Difficulty concentrating or making decisions? 0  Walking or climbing stairs? 0  Dressing or bathing? 0  Doing errands,  shopping? 0  Preparing Food and eating ? N  Using the Toilet? N  In the past six months, have you accidently leaked urine? N  Do you have problems with loss of bowel control? N  Managing your Medications? N  Managing your Finances? N  Housekeeping or managing your Housekeeping? N    Patient Care Team: Tonia Ghent, MD as PCP - Kela Millin, MD as Consulting Physician (Orthopedic Surgery) Erline Levine, MD as Consulting Physician (Neurosurgery)  Indicate any recent Medical Services you may have received from other than [facility] providers in the past year (date may be approximate).     Assessment:   This is a routine wellness examination for [place].  Hearing/Vision screen Hearing Screening - Comments:: Wears aids Vision Screening - Comments:: Readers- [facility]  Dietary issues and exercise activities discussed: Current Exercise Habits: Home exercise routine, Type of exercise: walking, Time (Minutes): 30, Frequency (Times/Week): 3, Weekly Exercise (Minutes/Week): 90, Intensity: Mild   Goals Addressed             This Visit's Progress    DIET - EAT MORE FRUITS AND VEGETABLES         Depression Screen    05/14/2022    1:13 PM 05/10/2021   10:42 AM 05/09/2020    2:11 PM 04/13/2019   12:11 PM 04/07/2018    2:50 PM 06/03/2017    2:18 PM 03/26/2016    2:16 PM  PHQ 2/9 Scores  PHQ - 2 Score 0 0 0 0 0 0 0  PHQ- 9 Score 0 0 0 0 0    Exception Documentation    Medical reason       Fall Risk    05/14/2022    1:17 PM 05/10/2021   10:42 AM 05/09/2020    2:09 PM 04/13/2019   12:11 PM 04/07/2018    2:50 PM  Fall Risk   Falls in the past year? 1 0 1 0 No  Comment   tripped on sidewalk    Number falls in past yr:  0 0 0    Injury with Fall? 0 0 1    Comment   broke wrist    Risk for fall due to : History of fall(s) Medication side effect Medication side effect    Follow up Falls prevention discussed Falls evaluation completed;Falls prevention discussed  Falls evaluation completed;Falls prevention discussed      FALL RISK PREVENTION PERTAINING TO THE HOME:  Any stairs in or around the home? Yes  If so, are there any without handrails? No  Home free of loose throw rugs in walkways, pet beds, electrical cords, etc? Yes  Adequate lighting in your home to reduce risk of falls? Yes   ASSISTIVE DEVICES UTILIZED TO PREVENT FALLS:  Life alert? No  Use of a cane, walker or w/c? No  Grab bars in the bathroom? No  Shower chair or bench in shower? No  Elevated toilet seat or a handicapped toilet? Yes    Cognitive Function:    05/10/2021   10:44 AM 05/09/2020    2:14 PM 04/13/2019   12:08 PM 04/07/2018    2:54 PM 03/26/2016    1:32 PM  MMSE - Mini Mental State Exam  Orientation to time '5 5 5 5 5  '$ Orientation to Place '5 5 5 5 5  '$ Registration '3 3 3 3 3  '$ Attention/ Calculation 5 5 0 0 0  Recall '3 3 3 3 3  '$ Language- name 2 objects   0 0 0  Language- repeat '1 1 1 1 1  '$ Language- follow 3 step command   0 3 3  Language- read & follow direction   0 0 0  Write a sentence   0 0 0  Copy design   0 0 0  Total score   '17 20 20        '$ 05/14/2022    1:20 PM  6CIT Screen  What Year? 0 points  What month? 0 points  What time? 0 points  Count back from 20 0 points  Months in reverse 0 points  Repeat phrase 0 points  Total Score 0 points    Immunizations Immunization History  Administered Date(s) Administered   Influenza Whole 07/20/2013   Influenza, Seasonal, Injecte, Preservative Fre 08/02/2021   Influenza,inj,Quad PF,6+ Mos 07/13/2017, 07/26/2018, 07/21/2019, 07/24/2020   Influenza-Unspecified 07/13/2017, 04/25/2018, 07/26/2018, 07/21/2019   Moderna Sars-Covid-2 Vaccination 11/23/2019, 12/22/2019, 08/16/2020   Td 01/02/2003   Tdap 05/27/2013   Zoster Recombinat (Shingrix) 07/24/2020, 09/23/2020    TDAP status: Up to date  Flu Vaccine status: Up to date  Pneumococcal vaccine status: Declined,  Education has been provided regarding  the importance of this vaccine but patient still declined. Advised may receive this vaccine at local pharmacy or Health Dept. Aware to provide a copy of the vaccination record if obtained from local pharmacy or Health Dept. Verbalized acceptance and understanding.   Covid-19 vaccine status: Completed vaccines  Qualifies for Shingles Vaccine? Yes   Zostavax completed No   Shingrix Completed?: Yes  Screening Tests Health Maintenance  Topic Date Due   COVID-19 Vaccine (4 - Moderna series) 10/11/2020   INFLUENZA VACCINE  05/20/2022   TETANUS/TDAP  05/28/2023   COLONOSCOPY (Pts 45-30yr Insurance coverage will need to be confirmed)  01/23/2025   Hepatitis C Screening  Completed   HIV Screening  Completed   Zoster Vaccines- Shingrix  Completed   HPV VACCINES  Aged Out    Health Maintenance  Health Maintenance Due  Topic Date Due   COVID-19  Vaccine (4 - Moderna series) 10/11/2020    Colorectal cancer screening: Type of screening: Colonoscopy. Completed 01/24/20. Repeat every 5 years  Lung Cancer Screening: (Low Dose CT Chest recommended if Age 10-80 years, 30 pack-year currently smoking OR have quit w/in 15years.) does not qualify.    Additional Screening:  Hepatitis C Screening: does qualify; Completed 10/02/15  Vision Screening: Recommended annual ophthalmology exams for early detection of glaucoma and other disorders of the eye. Is the patient up to date with their annual eye exam?  Yes  Who is the provider or what is the name of the office in which the patient attends annual eye exams? [facility] If pt is not established with a provider, would they like to be referred to a provider to establish care? No .   Dental Screening: Recommended annual dental exams for proper oral hygiene  Community Resource Referral / Chronic Care Management: CRR required this visit?  No   CCM required this visit?  No      Plan:     I have personally reviewed and noted the  following in the patient's chart:   Medical and social history Use of alcohol, tobacco or illicit drugs  Current medications and supplements including opioid prescriptions. Patient is not currently taking opioid prescriptions. Functional ability and status Nutritional status Physical activity Advanced directives List of other physicians Hospitalizations, surgeries, and ER visits in previous 12 months Vitals Screenings to include cognitive, depression, and falls Referrals and appointments  In addition, I have reviewed and discussed with patient certain preventive protocols, quality metrics, and best practice recommendations. A written personalized care plan for preventive services as well as general preventive health recommendations were provided to patient.     Andrew David, LPN   [id number]/81/[id number]   Nurse Notes: none

## 2022-05-15 ENCOUNTER — Ambulatory Visit (INDEPENDENT_AMBULATORY_CARE_PROVIDER_SITE_OTHER): Payer: Medicare Other | Admitting: Family Medicine

## 2022-05-15 ENCOUNTER — Encounter: Payer: Self-pay | Admitting: Family Medicine

## 2022-05-15 VITALS — BP 122/74 | HR 87 | Temp 97.8°F | Ht 76.0 in | Wt 221.0 lb

## 2022-05-15 DIAGNOSIS — R5383 Other fatigue: Secondary | ICD-10-CM

## 2022-05-15 DIAGNOSIS — Z0000 Encounter for general adult medical examination without abnormal findings: Secondary | ICD-10-CM

## 2022-05-15 DIAGNOSIS — E7800 Pure hypercholesterolemia, unspecified: Secondary | ICD-10-CM

## 2022-05-15 DIAGNOSIS — G8929 Other chronic pain: Secondary | ICD-10-CM

## 2022-05-15 DIAGNOSIS — Z7189 Other specified counseling: Secondary | ICD-10-CM

## 2022-05-15 DIAGNOSIS — M545 Low back pain, unspecified: Secondary | ICD-10-CM | POA: Diagnosis not present

## 2022-05-15 LAB — CBC WITH DIFFERENTIAL/PLATELET
Basophils Absolute: 0 10*3/uL (ref 0.0–0.1)
Basophils Relative: 0.7 % (ref 0.0–3.0)
Eosinophils Absolute: 0.2 10*3/uL (ref 0.0–0.7)
Eosinophils Relative: 2.7 % (ref 0.0–5.0)
HCT: 40.2 % (ref 39.0–52.0)
Hemoglobin: 13.7 g/dL (ref 13.0–17.0)
Lymphocytes Relative: 35.7 % (ref 12.0–46.0)
Lymphs Abs: 2.1 10*3/uL (ref 0.7–4.0)
MCHC: 34.1 g/dL (ref 30.0–36.0)
MCV: 89.1 fl (ref 78.0–100.0)
Monocytes Absolute: 0.5 10*3/uL (ref 0.1–1.0)
Monocytes Relative: 9.1 % (ref 3.0–12.0)
Neutro Abs: 3 10*3/uL (ref 1.4–7.7)
Neutrophils Relative %: 51.8 % (ref 43.0–77.0)
Platelets: 145 10*3/uL — ABNORMAL LOW (ref 150.0–400.0)
RBC: 4.51 Mil/uL (ref 4.22–5.81)
RDW: 14.1 % (ref 11.5–15.5)
WBC: 5.8 10*3/uL (ref 4.0–10.5)

## 2022-05-15 LAB — TSH: TSH: 1.24 u[IU]/mL (ref 0.35–5.50)

## 2022-05-15 MED ORDER — PRAVASTATIN SODIUM 20 MG PO TABS
ORAL_TABLET | ORAL | Status: DC
Start: 2022-05-15 — End: 2022-06-12

## 2022-05-15 MED ORDER — PRAVASTATIN SODIUM 20 MG PO TABS: 20.0000 mg | ORAL_TABLET | Freq: Every day | ORAL | Status: DC

## 2022-05-15 MED ORDER — CELECOXIB 100 MG PO CAPS: 100.0000 mg | ORAL_CAPSULE | Freq: Two times a day (BID) | ORAL | 3 refills | Status: DC

## 2022-05-15 MED ORDER — METHOCARBAMOL 500 MG PO TABS: 500.0000 mg | ORAL_TABLET | Freq: Three times a day (TID) | ORAL | 1 refills | Status: DC | PRN

## 2022-05-15 MED ORDER — TRAMADOL HCL 50 MG PO TABS: 50.0000 mg | ORAL_TABLET | Freq: Three times a day (TID) | ORAL | 1 refills | Status: DC | PRN

## 2022-05-15 NOTE — Progress Notes (Signed)
Back pain. H/o fusion, stimulator placement, taking celebrex prn.  Then 3 weeks ago he was helping a Industrial/product designer, tripped in the yard.  Fell back onto a cut log.  Bruising resolved in the meantime on the L lower back.  2 days after the fall he sneezed and the pain clearly got worse right at that point.   ER eval d/w pt.   Imaging report discussed with patient. Unremarkable evaluation of the chest and left ribs.  Postoperative findings within the lumbar spine as noted with no acute abnormality suggested.   Robaxin helped but it was expensive for patient.  Tizanidine didn't help as much.  D/w pt about using tramadol prn.    Elevated Cholesterol: Using medications without problems: see below.   Muscle aches: see above.  Diet compliance:d/w pt.   Exercise: d/w pt.   Fatigue noted.  Over the last few months.  Unclear if from statin.    Tetanus 2014 PNA not due Shingrix done Flu shot done yearly.   covid 2021 PSA wnl 2023 Colon 2021 Living will d/w pt.  Wife [name] designated if patient were incapacitated.    D/w pt about AAA screening, not due yet.    PMH and SH reviewed  Meds, vitals, and allergies reviewed.   ROS: Per HPI unless specifically indicated in ROS section   GEN: nad, alert and oriented HEENT: ncat NECK: supple w/o LA CV: rrr. PULM: ctab, no inc wob ABD: soft, +bs EXT: no edema SKIN: no acute rash  30 minutes were devoted to patient care in this encounter (this includes time spent reviewing the patient's file/history, interviewing and examining the patient, counseling/reviewing plan with patient).

## 2022-05-15 NOTE — Patient Instructions (Signed)
Go to the lab on the way out.   If you have mychart we'll likely use that to update you.    Take care.  Glad to see you. Stop pravastatin for 2 weeks and see how you feel.  Either way, let me know.   Try robaxin and tramadol if needed for pain.

## 2022-05-18 DIAGNOSIS — R5383 Other fatigue: Secondary | ICD-10-CM | POA: Insufficient documentation

## 2022-05-18 NOTE — Assessment & Plan Note (Signed)
See notes on labs.  Unclear if statin related.

## 2022-05-18 NOTE — Assessment & Plan Note (Signed)
Some fatigue noted.  Unclear from statin.  I asked him to stop pravastatin for 2 weeks and then let me know how he feels.  He agrees with plan.

## 2022-05-18 NOTE — Assessment & Plan Note (Addendum)
Robaxin helped but it was expensive for patient.  Tizanidine didn't help as much.  D/w pt about using tramadol prn.  Prescription done for Robaxin and I will check with staff here about getting a prior authorization done.  He will update me as needed.  Okay for outpatient follow-up.

## 2022-05-18 NOTE — Assessment & Plan Note (Signed)
Tetanus 2014 PNA not due Shingrix done Flu shot done yearly.   covid 2021 PSA wnl 2023 Colon 2021 Living will d/w pt.  Wife [name] designated if patient were incapacitated.    D/w pt about AAA screening, not due yet.

## 2022-05-18 NOTE — Assessment & Plan Note (Signed)
Living will d/w pt.  Wife [name] designated if patient were incapacitated.

## 2022-05-21 ENCOUNTER — Telehealth: Payer: Self-pay

## 2022-05-21 NOTE — Telephone Encounter (Signed)
Prior auth started for Methocarbamol '500MG'$  tablets. [employer] Key: Byrd Hesselbach - [place] Case ID: [id number] Waiting for determination.

## 2022-05-22 NOTE — Telephone Encounter (Signed)
Prior auth for Methocarbamol '500MG'$  tablets has been approved. [employer] Key: Byrd Hesselbach - [place] Case ID: [id number]  METHOCARBAM TAB '500MG'$ , use as directed, is approved for non-formulary exception through  10/19/2022 under your Medicare Part D benefit. Reviewed by: System.  Notified patient via mychart.  Approval letter sent to scanning.

## 2022-05-29 ENCOUNTER — Encounter: Payer: Self-pay | Admitting: Family Medicine

## 2022-05-29 ENCOUNTER — Other Ambulatory Visit: Payer: Self-pay | Admitting: Family Medicine

## 2022-05-29 DIAGNOSIS — E785 Hyperlipidemia, unspecified: Secondary | ICD-10-CM

## 2022-06-05 ENCOUNTER — Telehealth: Payer: Self-pay | Admitting: Family Medicine

## 2022-06-05 NOTE — Chronic Care Management (AMB) (Signed)
  Chronic Care Management   Outreach Note  06/05/2022 Name: Andrew [name] MRN: [id number] DOB: 1956-09-10  Referred by: Tonia Ghent, MD Reason for referral : No chief complaint on file.   An unsuccessful telephone outreach was attempted today. The patient was referred to the pharmacist for assistance with care management and care coordination.   Follow Up Plan:   Tatjana Dellinger Upstream Scheduler

## 2022-06-06 ENCOUNTER — Telehealth: Payer: Self-pay | Admitting: Family Medicine

## 2022-06-06 NOTE — Progress Notes (Signed)
  Chronic Care Management   Note  06/06/2022 Name: ALIAS VILLAGRAN MRN: [id number] DOB: 1956-03-13  ASCENSION STFLEUR is a 66 y.o. year old male who is a primary care patient of Tonia Ghent, MD. I reached out to [name] by phone today in response to a referral sent by Mr. ATTICUS WEDIN PCP, Tonia Ghent, MD.   Mr. Haymore was given information about Chronic Care Management services today including:  CCM service includes personalized support from designated clinical staff supervised by his physician, including individualized plan of care and coordination with other care providers 24/7 contact phone numbers for assistance for urgent and routine care needs. Service will only be billed when office clinical staff spend 20 minutes or more in a month to coordinate care. Only one practitioner may furnish and bill the service in a calendar month. The patient may stop CCM services at any time (effective at the end of the month) by phone call to the office staff.   Patient agreed to services and verbal consent obtained.   Follow up plan:referral/ pt aware ded   Tatjana Dellinger Upstream Scheduler

## 2022-06-09 ENCOUNTER — Telehealth: Payer: Self-pay

## 2022-06-09 NOTE — Progress Notes (Signed)
Chronic Care Management Pharmacy Assistant   Name: BONNY EGGER  MRN: [id number] DOB: 06-14-1956  Reason for Encounter: CCM (Initial Questions)   Recent office visits:  05/29/22 Patient message on Pravastatin follow up: " have seen a markedly decrease in the fatigue. Not completely, but better than it was. Do we change meds now?" Dr. Josefine Class reply: " I think it makes sense to talk to pharmacy staff here about your situation.  I preemptively put in a referral to the pharmacist here in clinic for guidance.  I would stay off pravastatin for now. " 05/15/22 Elsie Stain, MD [facility] "Your blood counts and thyroid test do not show any reason for fatigue.  Your platelets are minimally low but this is not significant and overall better than some of your previous values.  See how you feel with the pravastatin and let me know after about 2 weeks." Start: traMADol (ULTRAM) 50 MG tablet Change: Methocarbamol 500 mg every 8 hrs vs. PRN Hold for two weeks: Pravastatin 20 mg Stop (patient): Tums 500 mg Stop (completed): Toradol 10 mg  05/14/22 AWV  Recent consult visits:  None in past six months  Hospital visits:  Reviewed hospital notes for details of recent visit.  Admitted to the ED on 04/28/22. Discharge date was 04/28/22.  Discharged from [facility].   Discharge diagnosis (Principal Problem): Contusion of rib on left side Patient was discharged to Home  Brief summary of hospital course: Afebrile, nontoxic-appearing 66 year old male presents the emergency department for evaluation of fall that occurred over the weekend. Initial vitals show moderate hypertension 162/94, nontachycardic, afebrile, oxygenating adequately room air. Physical exam does show area of ecchymosis noted to the inferior, posterior left rib cage without obvious step-offs or bony deformities. Lungs clear to auscultation bilaterally. Patient is overall well-appearing, found seated comfortably no acute  distress, alert and oriented, GCS 15. Will give Lidoderm patch, Norflex. Patient does have a ride home and did take analgesia prior to arrival although notes that was expired and possibly ineffective. Will order plain films to evaluate for acute osseous abnormalities, acute cardiopulmonary pathology or rib fractures. Plain films unremarkable for acute findings. No pneumothorax or acute cardiopulmonary pathology. Low suspicion for intraabdominal/organ injury given physical exam and history. Do suspect contusion/other soft tissue injury. Discussed analgesia options as well as symptomatic relief with the patient is agreeable to trial muscle relaxers and Toradol. Recommend against driving or operating heavy machinery while taking muscle relaxers. Discussed imaging results with patient who is agreeable to discharge at this time. I have independently reviewed and interpreted the images ordered. Discussed symptoms warranting return to the emergency department. Discussed at home symptomatic treatment as well as appropriate follow up. Patient advised to return for new or worsening symptoms.  New?Medications Started at [facility] Discharge:?? -Started ketorolac (TORADOL) 10 mg tablet    -Started lidocaine (LIDODERM) 5 % patch   -Started methocarbamoL (ROBAXIN) 500 mg tablet    Medications that remain the same after Hospital Discharge:??  -All other medications will remain the same.    Medications: Outpatient Encounter Medications as of 06/09/2022  Medication Sig   acetaminophen (TYLENOL) 650 MG CR tablet Take 1,300 mg by mouth every 8 (eight) hours as needed for pain.   celecoxib (CELEBREX) 100 MG capsule Take 1 capsule (100 mg total) by mouth 2 (two) times daily.   docusate sodium (COLACE) 50 MG capsule Take 50 mg by mouth 2 (two) times daily as needed for mild constipation.  methocarbamol (ROBAXIN) 500 MG tablet Take 1 tablet (500 mg total) by mouth every 8 (eight) hours as needed for muscle spasms.    Omega-3 Fatty Acids (FISH OIL) 1000 MG CAPS Take 1,000 mg by mouth daily.   omeprazole (PRILOSEC) 20 MG capsule Take 20 mg by mouth as needed.   pravastatin (PRAVACHOL) 20 MG tablet HELD AS OF 05/15/22   traMADol (ULTRAM) 50 MG tablet Take 1 tablet (50 mg total) by mouth every 8 (eight) hours as needed.   No facility-administered encounter medications on file as of 06/09/2022.    Lab Results  Component Value Date/Time   HGBA1C 5.9 05/07/2022 07:22 AM   HGBA1C 5.7 05/08/2021 07:40 AM   MICROALBUR 0.6 04/19/2008 10:12 AM     BP Readings from Last 3 Encounters:  05/15/22 122/74  05/13/21 128/74  05/10/20 126/78    Unsuccessful attempt to reach patient. Left patient message reminding patient of appointment.  Patient contacted to confirm telephone appointment with [name], Pharm D, on 06/12/22 at 10:15.  Care Gaps: Annual wellness visit in last year? Yes 05/14/22 Most Recent BP reading: 122/74 on 05/15/22  CCM referral has been placed prior to visit?  Yes   Star Rating Drugs: Medication:  Last Fill: Day Supply Pravastatin 20 mg 05/15/22 [place], CPP notified  Marijean Niemann, [place] Pharmacy Assistant [phone]

## 2022-06-12 ENCOUNTER — Ambulatory Visit (INDEPENDENT_AMBULATORY_CARE_PROVIDER_SITE_OTHER): Payer: Medicare Other | Admitting: Pharmacist

## 2022-06-12 DIAGNOSIS — M545 Low back pain, unspecified: Secondary | ICD-10-CM

## 2022-06-12 DIAGNOSIS — E7800 Pure hypercholesterolemia, unspecified: Secondary | ICD-10-CM

## 2022-06-12 MED ORDER — REPATHA SURECLICK 140 MG/ML ~~LOC~~ SOAJ: 140.0000 mg | SUBCUTANEOUS | 3 refills | Status: DC

## 2022-06-12 NOTE — Progress Notes (Signed)
Chronic Care Management Pharmacy Note  06/19/2022 Name:  Andrew [name] MRN:  [id number] DOB:  1956/09/27  Summary: CCM Initial visit -HLD: LDL 101 on pravastatin 20 mg, pt had to stop pravastatin due to fatigue which improved off medication. Reviewed ideal LDL goal < 70 for lowest risk of ASCVD, pt agrees to target this goal. He opted for trial of Repatha over intermittent-dosed statin or ezetimibe.  Recommendations/Changes made from today's visit: -Start Repatha 140 mg injection q14 days. PA approved. Pt to call with any cost issues and to schedule injection training. -Recommend re-check lipid panel 6-8 weeks after starting Repatha  Plan: -[place] will call patient 1 week re: Repatha -Pharmacist follow up televisit scheduled for 1 month    Subjective: Andrew [name] is an 66 y.o. year old male who is a primary patient of Damita Dunnings, Elveria Rising, MD.  The CCM team was consulted for assistance with disease management and care coordination needs.    Engaged with patient by telephone for initial visit in response to provider referral for pharmacy case management and/or care coordination services.   Consent to Services:  The patient was given the following information about Chronic Care Management services today, agreed to services, and gave verbal consent: 1. CCM service includes personalized support from designated clinical staff supervised by the primary care provider, including individualized plan of care and coordination with other care providers 2. 24/7 contact phone numbers for assistance for urgent and routine care needs. 3. Service will only be billed when office clinical staff spend 20 minutes or more in a month to coordinate care. 4. Only one practitioner may furnish and bill the service in a calendar month. 5.The patient may stop CCM services at any time (effective at the end of the month) by phone call to the office staff. 6. The patient will be responsible for cost sharing  (co-pay) of up to 20% of the service fee (after annual deductible is met). Patient agreed to services and consent obtained.  Patient Care Team: Tonia Ghent, MD as PCP - Kela Millin, MD as Consulting Physician (Orthopedic Surgery) Erline Levine, MD as Consulting Physician (Neurosurgery) Charlton Haws, [facility] as Pharmacist (Pharmacist)  Recent office visits: 05/15/22 Dr Damita Dunnings OV: annual - fatigue/myalgias, unclear if from statin. Hold pravastatin for 2 weeks and update. Rx'd methocarbamol and tramadol.  Recent consult visits: [facility] visits: Admitted to the ED on 04/28/22. Discharge date was 04/28/22.  Discharged from [facility].   Discharge diagnosis (Principal Problem): Contusion of rib on left side Patient was discharged to Home   Brief summary of hospital course: Afebrile, nontoxic-appearing 66 year old male presents the emergency department for evaluation of fall that occurred over the weekend. Initial vitals show moderate hypertension 162/94, nontachycardic, afebrile, oxygenating adequately room air. Physical exam does show area of ecchymosis noted to the inferior, posterior left rib cage without obvious step-offs or bony deformities. Lungs clear to auscultation bilaterally. Patient is overall well-appearing, found seated comfortably no acute distress, alert and oriented, GCS 15. Will give Lidoderm patch, Norflex. Patient does have a ride home and did take analgesia prior to arrival although notes that was expired and possibly ineffective. Will order plain films to evaluate for acute osseous abnormalities, acute cardiopulmonary pathology or rib fractures. Plain films unremarkable for acute findings. No pneumothorax or acute cardiopulmonary pathology. Low suspicion for intraabdominal/organ injury given physical exam and history. Do suspect contusion/other soft tissue injury. Discussed analgesia options as well as  symptomatic relief with the patient  is agreeable to trial muscle relaxers and Toradol. Recommend against driving or operating heavy machinery while taking muscle relaxers. Discussed imaging results with patient who is agreeable to discharge at this time. I have independently reviewed and interpreted the images ordered. Discussed symptoms warranting return to the emergency department. Discussed at home symptomatic treatment as well as appropriate follow up. Patient advised to return for new or worsening symptoms.   New?Medications Started at [facility] Discharge:?? -Started ketorolac (TORADOL) 10 mg tablet    -Started lidocaine (LIDODERM) 5 % patch   -Started methocarbamoL (ROBAXIN) 500 mg tablet     Medications that remain the same after Hospital Discharge:??  -All other medications will remain the same.     Objective:  Lab Results  Component Value Date   CREATININE 0.82 05/07/2022   BUN 16 05/07/2022   GFR 92.59 05/07/2022   GFRNONAA 105.99 05/13/2010   GFRAA  03/15/2009    >60        The eGFR has been calculated using the MDRD equation. This calculation has not been validated in all clinical situations. eGFR's persistently <60 mL/min signify possible Chronic Kidney Disease.   NA 140 05/07/2022   K 4.9 05/07/2022   CALCIUM 9.4 05/07/2022   CO2 27 05/07/2022   GLUCOSE 106 (H) 05/07/2022    Lab Results  Component Value Date/Time   HGBA1C 5.9 05/07/2022 07:22 AM   HGBA1C 5.7 05/08/2021 07:40 AM   GFR 92.59 05/07/2022 07:22 AM   GFR 91.91 05/08/2021 07:40 AM   MICROALBUR 0.6 04/19/2008 10:12 AM    Last diabetic Eye exam: No results found for: "HMDIABEYEEXA"  Last diabetic Foot exam: No results found for: "HMDIABFOOTEX"   Lab Results  Component Value Date   CHOL 164 05/07/2022   HDL 43.90 05/07/2022   LDLCALC 101 (H) 05/07/2022   TRIG 93.0 05/07/2022   CHOLHDL 4 05/07/2022       Latest Ref Rng & Units 05/07/2022    7:22 AM 05/08/2021    7:40 AM 05/09/2020    7:38 AM  Hepatic Function  Total Protein  6.0 - 8.3 g/dL 7.2  7.1  6.7   Albumin 3.5 - 5.2 g/dL 4.9  4.6  4.4   AST 0 - 37 U/L _0 ALT 0 - 53 U/L 31  28  34   Alk Phosphatase 39 - 117 U/L 59  54  51   Total Bilirubin 0.2 - 1.2 mg/dL 0.6  0.6  0.4     Lab Results  Component Value Date/Time   TSH 1.24 05/15/2022 10:54 AM   TSH 1.21 04/24/2009 09:23 AM       Latest Ref Rng & Units 05/15/2022   10:54 AM 08/12/2019   10:30 AM 06/13/2015    2:38 PM  CBC  WBC 4.0 - 10.5 K/uL 5.8  6.9  11.1   Hemoglobin 13.0 - 17.0 g/dL 13.7  16.2  15.6   Hematocrit 39.0 - 52.0 % 40.2  46.4  46.3   Platelets 150.0 - 400.0 K/uL 145.0  153  115.0     Lab Results  Component Value Date/Time   VD25OH 26 (L) 04/24/2009 10:04 PM    Clinical ASCVD: No  The 10-year ASCVD risk score (Arnett DK, et al., 2019) is: 14.4%   Values used to calculate the score:     Age: 65 years     Sex: Male     Is Non-Hispanic African American:  No     Diabetic: No     Tobacco smoker: Yes     Systolic Blood Pressure: 671 mmHg     Is BP treated: No     HDL Cholesterol: 43.9 mg/dL     Total Cholesterol: 164 mg/dL       05/14/2022    1:13 PM 05/10/2021   10:42 AM 05/09/2020    2:11 PM  Depression screen PHQ 2/9  Decreased Interest 0 0 0  Down, Depressed, Hopeless 0 0 0  PHQ - 2 Score 0 0 0  Altered sleeping 0 0 0  Tired, decreased energy 0 0 0  Change in appetite 0 0 0  Feeling bad or failure about yourself  0 0 0  Trouble concentrating 0 0 0  Moving slowly or fidgety/restless 0 0 0  Suicidal thoughts 0 0 0  PHQ-9 Score 0 0 0  Difficult doing work/chores Not difficult at all Not difficult at all Not difficult at all     Social History   Tobacco Use  Smoking Status Former  Smokeless Tobacco Former   BP Readings from Last 3 Encounters:  05/15/22 122/74  05/13/21 128/74  05/10/20 126/78   Pulse Readings from Last 3 Encounters:  05/15/22 87  05/13/21 75  05/10/20 95   Wt Readings from Last 3 Encounters:  05/15/22 221 lb (100.2 kg)   05/14/22 216 lb (98 kg)  05/13/21 216 lb (98 kg)   BMI Readings from Last 3 Encounters:  05/15/22 26.90 kg/m  05/14/22 26.29 kg/m  05/13/21 26.29 kg/m    Assessment/Interventions: Review of patient past medical history, allergies, medications, health status, including review of consultants reports, laboratory and other test data, was performed as part of comprehensive evaluation and provision of chronic care management services.   SDOH:  (Social Determinants of Health) assessments and interventions performed: No  SDOH Screenings   Alcohol Screen: Low Risk  (05/14/2022)   Alcohol Screen    Last Alcohol Screening Score (AUDIT): 0  Depression (PHQ2-9): Low Risk  (05/14/2022)   Depression (PHQ2-9)    PHQ-2 Score: 0  Financial Resource Strain: Low Risk  (05/14/2022)   Overall Financial Resource Strain (CARDIA)    Difficulty of Paying Living Expenses: Not hard at all  Food Insecurity: No Food Insecurity (05/14/2022)   Hunger Vital Sign    Worried About Running Out of Food in the Last Year: Never true    Ran Out of Food in the Last Year: Never true  Housing: Low Risk  (05/14/2022)   Housing    Last Housing Risk Score: 0  Physical Activity: Insufficiently Active (05/14/2022)   Exercise Vital Sign    Days of Exercise per Week: 3 days    Minutes of Exercise per Session: 30 min  Social Connections: Moderately Integrated (05/14/2022)   Social Connection and Isolation Panel [NHANES]    Frequency of Communication with Friends and Family: More than three times a week    Frequency of Social Gatherings with Friends and Family: Once a week    Attends Religious Services: More than 4 times per year    Active Member of Clubs or Organizations: No    Attends Archivist Meetings: Never    Marital Status: Married  Stress: No Stress Concern Present (05/14/2022)   [employer] [place]    Feeling of Stress : Not at all  Tobacco Use:  Medium Risk (05/15/2022)   Patient History    Smoking Tobacco  Use: Former    Smokeless Tobacco Use: Former    Passive Exposure: Not on Pensions consultant Needs: No Transportation Needs (05/14/2022)   PRAPARE - Hydrologist (Medical): No    Lack of Transportation (Non-Medical): No    CCM Care Plan  Allergies  Allergen Reactions   Lyrica [Pregabalin]     No relief of pain   Neurontin [Gabapentin]     No relief of pain   Atorvastatin Calcium Other (See Comments)    Joint aches   Escitalopram Oxalate Anxiety    SLEEPLESSNESS   Pravastatin Sodium Other (See Comments)    fatigue   Sertraline Hcl Anxiety    Medications Reviewed Today     Reviewed by Charlton Haws, [facility] (Pharmacist) on 06/12/22 at 1049  Med List Status: <None>   Medication Order Taking? Sig Documenting Provider Last Dose Status Informant  acetaminophen (TYLENOL) 650 MG CR tablet [id number] Yes Take 1,300 mg by mouth every 8 (eight) hours as needed for pain. [provider] Taking Active Self  celecoxib (CELEBREX) 100 MG capsule [id number] Yes Take 1 capsule (100 mg total) by mouth 2 (two) times daily. Tonia Ghent, MD Taking Active   docusate sodium (COLACE) 50 MG capsule [id number] Yes Take 50 mg by mouth 2 (two) times daily as needed for mild constipation. [provider] Taking Active Self  methocarbamol (ROBAXIN) 500 MG tablet [id number] Yes Take 1 tablet (500 mg total) by mouth every 8 (eight) hours as needed for muscle spasms. Tonia Ghent, MD Taking Active   Omega-3 Fatty Acids (FISH OIL) 1000 MG CAPS [id number] Yes Take 1,000 mg by mouth daily. [provider] Taking Active Self           Med Note Damita Dunnings, Iverson Alamin May 10, 2020  8:30 AM)    omeprazole (PRILOSEC) 20 MG capsule [id number] Yes Take 20 mg by mouth as needed. [provider] Taking Active   traMADol (ULTRAM) 50 MG tablet [id number] Yes Take 1 tablet (50 mg total) by mouth  every 8 (eight) hours as needed. Tonia Ghent, MD Taking Active             Patient Active Problem List   Diagnosis Date Noted   Other fatigue 05/18/2022   Lumbar foraminal stenosis 08/16/2019   Health care maintenance 04/08/2018   Advance care planning 06/01/2014   Insomnia 05/27/2013   Erectile dysfunction 01/06/2013   Medicare annual wellness visit, subsequent 05/21/2012   Back pain 03/15/2011   HYPERCHOLESTEROLEMIA (297 LDL 210) 04/22/2007   Hearing loss 04/18/2007   Hyperglycemia 04/18/2007    Immunization History  Administered Date(s) Administered   Influenza Whole 07/20/2013   Influenza, Seasonal, Injecte, Preservative Fre 08/02/2021   Influenza,inj,Quad PF,6+ Mos 07/13/2017, 07/26/2018, 07/21/2019, 07/24/2020   Influenza-Unspecified 07/13/2017, 04/25/2018, 07/26/2018, 07/21/2019   Moderna Sars-Covid-2 Vaccination 11/23/2019, 12/22/2019, 08/16/2020   Td 01/02/2003   Tdap 05/27/2013   Zoster Recombinat (Shingrix) 07/24/2020, 09/23/2020    Conditions to be addressed/monitored:  Hyperlipidemia and back pain  Care Plan : [place]  Updates made by Charlton Haws, [place] since 06/19/2022 12:00 AM     Problem: Hyperlipidemia and back pain   Priority: High     Long-Range Goal: Disease mgmt   Start Date: 06/19/2022  Expected End Date: 12/18/2022  This Visit's Progress: On track  Priority: High  Note:   Current Barriers:  Unable to achieve control of HLD  Pharmacist Clinical Goal(s):  Patient will adhere to plan to optimize therapeutic regimen for HLD as evidenced by report of adherence to recommended medication management changes through collaboration with PharmD and provider.   Interventions: 1:1 collaboration with Tonia Ghent, MD regarding development and update of comprehensive plan of care as evidenced by provider attestation and co-signature Inter-disciplinary care team collaboration (see longitudinal plan of care) Comprehensive  medication review performed; medication list updated in electronic medical record  Hyperlipidemia: (LDL goal < 70) -Not ideally controlled - LDL 101 (04/2022) while on pravastatin, highest LDL on record was 176 (2018) while off statins -ASCVD risk 14.4% -Current treatment: Fish oil 1000 mg - Appropriate, Effective, Safe, Accessible -Medications previously tried: atorvastatin 20 mg (joint aches), pravastatin 20 mg (fatigue, stopped 04/2022) -Educated on cholesterol goals - discussed ideal goal < 70 for lowest risk of ASCVD, pt agrees he would like to achieve this goal.  -Reviewed options: trial of rosuvastatin 5 mg 3x weekly OR ezetimibe 10 mg daily OR Repatha injections. Given ezetimibe is unlikely to achieve LDL goal without additional medication, pt opted for Repatha injections -Plan: Start Repatha Sureclick 408 mg injection every 14 days. Submitted PA and PA approved (Key: [id number]). Pt to call PharmD with any cost issues and to schedule injection training. Re-check lipids in 6-8 weeks.  Back pain (Goal: manage pain) -Controlled -With spinal cord stimulator. S/p lumbar fusion, diskectomy. Prev seen by Dr. Vertell Limber with input on medical mgmt by [facility] -Current treatment  Celecoxib 100 mg BID - Appropriate, Effective, Safe, Accessible Methocarbamol 500 mg -Appropriate, Effective, Safe, Accessible Tramadol 50 mg -Appropriate, Effective, Safe, Accessible Tylenol 650 mg -Appropriate, Effective, Safe, Accessible -Medications previously tried: n/a  -Recommended to continue current medication  Health Maintenance -Vaccine gaps: none  Patient Goals/Self-Care Activities Patient will:  - take medications as prescribed as evidenced by patient report and record review focus on medication adherence by routine      Medication Assistance: None required.  Patient affirms current coverage meets needs.  Compliance/Adherence/Medication fill history: Care Gaps: None  Star-Rating  Drugs: None  Medication Access: Within the past 30 days, how often has patient missed a dose of medication? 0 Is a pillbox or other method used to improve adherence? No  Factors that may affect medication adherence? adverse effects of medications Are meds synced by current pharmacy? No  Are meds delivered by current pharmacy? Yes  Does patient experience delays in picking up medications due to transportation concerns? No   Upstream Services Reviewed: Is patient disadvantaged to use [facility] Pharmacy?: Yes  Current Rx insurance plan: [facility] [place] Name and location of Current pharmacy:  [place], [place] [address] [place] [address] Phone: [phone] Fax: [phone]  [facility] ([facility]) - [place], [address] [place] [address] Phone: [phone] Fax: [phone]  [facility] services reviewed with patient today?: No  Patient requests to transfer care to [facility] Pharmacy?: No  Reason patient declined to change pharmacies: Disadvantaged due to insurance/mail order and Not mentioned at this visit   Care Plan and Follow Up Patient Decision:  Patient agrees to Care Plan and Follow-up.  Plan: Telephone follow up appointment with care management team member scheduled for:  1 month  [name], PharmD, BCACP Clinical Pharmacist [facility] at  [place] [phone]

## 2022-06-19 ENCOUNTER — Telehealth: Payer: Self-pay | Admitting: Pharmacist

## 2022-06-19 DIAGNOSIS — E7800 Pure hypercholesterolemia, unspecified: Secondary | ICD-10-CM

## 2022-06-19 NOTE — Telephone Encounter (Signed)
Discussed hyperlipidemia medication options with patient. LDL 101 on pravastatin 20 mg, pt had to stop pravastatin due to fatigue which improved off medication. Reviewed ideal LDL goal < 70 for lowest risk of ASCVD, pt agrees to target this goal. He opted for trial of Repatha over intermittent-dosed statin or ezetimibe.   Recommendations -Start Repatha 140 mg injection q14 days. PA submitted and approved -Recommend re-check lipid panel 6-8 weeks after starting Repatha

## 2022-06-19 NOTE — Patient Instructions (Signed)
Visit Information  Phone number for Pharmacist: [phone]   Goals Addressed   None     Care Plan : [place]  Updates made by Charlton Haws, RPH since 06/19/2022 12:00 AM     Problem: Hyperlipidemia and back pain   Priority: High     Long-Range Goal: Disease mgmt   Start Date: 06/19/2022  Expected End Date: 12/18/2022  This Visit's Progress: On track  Priority: High  Note:   Current Barriers:  Unable to achieve control of HLD   Pharmacist Clinical Goal(s):  Patient will adhere to plan to optimize therapeutic regimen for HLD as evidenced by report of adherence to recommended medication management changes through collaboration with PharmD and provider.   Interventions: 1:1 collaboration with Tonia Ghent, MD regarding development and update of comprehensive plan of care as evidenced by provider attestation and co-signature Inter-disciplinary care team collaboration (see longitudinal plan of care) Comprehensive medication review performed; medication list updated in electronic medical record  Hyperlipidemia: (LDL goal < 70) -Not ideally controlled - LDL 101 (04/2022) while on pravastatin, highest LDL on record was 176 (2018) while off statins -ASCVD risk 14.4% -Current treatment: Fish oil 1000 mg - Appropriate, Effective, Safe, Accessible -Medications previously tried: atorvastatin 20 mg (joint aches), pravastatin 20 mg (fatigue, stopped 04/2022) -Educated on cholesterol goals - discussed ideal goal < 70 for lowest risk of ASCVD, pt agrees he would like to achieve this goal.  -Reviewed options: trial of rosuvastatin 5 mg 3x weekly OR ezetimibe 10 mg daily OR Repatha injections. Given ezetimibe is unlikely to achieve LDL goal without additional medication, pt opted for Repatha injections -Plan: Start Repatha Sureclick 295 mg injection every 14 days. Submitted PA and PA approved (Key: [id number]). Pt to call PharmD with any cost issues and to schedule  injection training. Re-check lipids in 6-8 weeks.  Back pain (Goal: manage pain) -Controlled -With spinal cord stimulator. S/p lumbar fusion, diskectomy. Prev seen by Dr. Vertell Limber with input on medical mgmt by [facility] -Current treatment  Celecoxib 100 mg BID - Appropriate, Effective, Safe, Accessible Methocarbamol 500 mg -Appropriate, Effective, Safe, Accessible Tramadol 50 mg -Appropriate, Effective, Safe, Accessible Tylenol 650 mg -Appropriate, Effective, Safe, Accessible -Medications previously tried: n/a  -Recommended to continue current medication  Health Maintenance -Vaccine gaps: none  Patient Goals/Self-Care Activities Patient will:  - take medications as prescribed as evidenced by patient report and record review focus on medication adherence by routine      Patient verbalizes understanding of instructions and care plan provided today and agrees to view in [place]. Active MyChart status and patient understanding of how to access instructions and care plan via MyChart confirmed with patient.    Telephone follow up appointment with pharmacy team member scheduled for: 1 month  [name], PharmD, [facility] Clinical Pharmacist [facility] at [facility] [phone]

## 2022-06-20 ENCOUNTER — Encounter: Payer: Self-pay | Admitting: Family Medicine

## 2022-06-20 NOTE — Telephone Encounter (Signed)
Please check with patient. Have him update [place] if he doesn't tolerate repatha.  If tolerated, I would like to recheck lipids in about 2 months.  Does he want to do that closer to home?  If so I can send an order for labs.  If he prefers here, then let me know and I'll put in the order.  Thanks.

## 2022-06-20 NOTE — Telephone Encounter (Signed)
Unable to reach pt by phone and left detailed v/m requesting pt to cb to [facility] for info requested by Dr Damita Dunnings. Sending note to [facility].

## 2022-06-24 NOTE — Telephone Encounter (Signed)
Patient responded via mychart.

## 2022-07-09 ENCOUNTER — Telehealth: Payer: Self-pay

## 2022-07-09 NOTE — Progress Notes (Signed)
    Chronic Care Management Pharmacy Assistant   Name: Andrew [name]  MRN: [id number] DOB: Mar 21, 1956  Reason for Encounter: CCM (Appointment Reminder)  Medications: Outpatient Encounter Medications as of 07/09/2022  Medication Sig   acetaminophen (TYLENOL) 650 MG CR tablet Take 1,300 mg by mouth every 8 (eight) hours as needed for pain.   celecoxib (CELEBREX) 100 MG capsule Take 1 capsule (100 mg total) by mouth 2 (two) times daily.   docusate sodium (COLACE) 50 MG capsule Take 50 mg by mouth 2 (two) times daily as needed for mild constipation.   Evolocumab (REPATHA SURECLICK) 254 MG/ML SOAJ Inject 140 mg into the skin every 14 (fourteen) days.   methocarbamol (ROBAXIN) 500 MG tablet Take 1 tablet (500 mg total) by mouth every 8 (eight) hours as needed for muscle spasms.   Omega-3 Fatty Acids (FISH OIL) 1000 MG CAPS Take 1,000 mg by mouth daily.   omeprazole (PRILOSEC) 20 MG capsule Take 20 mg by mouth as needed.   traMADol (ULTRAM) 50 MG tablet Take 1 tablet (50 mg total) by mouth every 8 (eight) hours as needed.   No facility-administered encounter medications on file as of 07/09/2022.   [name] was contacted to remind of upcoming telephone visit with [name] on 07/16/2022 at 3:00. Patient was reminded to have any blood glucose and blood pressure readings available for review at appointment.   Message was left reminding patient of appointment.  CCM referral has been placed prior to visit?  Yes   Star Rating Drugs: Medication:                Last Fill:         Day Supply Pravastatin 20 mg       05/15/22          [place], CPP notified   Marijean Niemann, [place] Pharmacy Assistant [phone]

## 2022-07-10 ENCOUNTER — Other Ambulatory Visit: Payer: Self-pay | Admitting: Family Medicine

## 2022-07-11 ENCOUNTER — Other Ambulatory Visit: Payer: Self-pay

## 2022-07-11 DIAGNOSIS — E7800 Pure hypercholesterolemia, unspecified: Secondary | ICD-10-CM

## 2022-07-11 MED ORDER — REPATHA SURECLICK 140 MG/ML ~~LOC~~ SOAJ: 140.0000 mg | SUBCUTANEOUS | 3 refills | Status: DC

## 2022-07-11 NOTE — Progress Notes (Signed)
Refill request from [facility] for repatha sureclick. Erx sent.

## 2022-07-16 ENCOUNTER — Ambulatory Visit (INDEPENDENT_AMBULATORY_CARE_PROVIDER_SITE_OTHER): Payer: Medicare Other | Admitting: Pharmacist

## 2022-07-16 DIAGNOSIS — E7800 Pure hypercholesterolemia, unspecified: Secondary | ICD-10-CM

## 2022-07-16 DIAGNOSIS — M545 Low back pain, unspecified: Secondary | ICD-10-CM

## 2022-07-16 NOTE — Progress Notes (Signed)
Chronic Care Management Pharmacy Note  07/16/2022 Name:  Andrew [name] MRN:  [id number] DOB:  Mar 27, 1956  Summary: CCM F/U visit -HLD: pt started Repatha 9/4, he reports compliance with q2 week dosing and denies side effects so far; he also reports it is affordable; pt agrees to LDL goal < 70  Recommendations/Changes made from today's visit: -Recommend re-check lipid panel 6-8 weeks after starting Repatha (Lab appt made 09/03/22)  Plan: -Transition CCM to Self Care: Patient achieved CCM goals and no longer needs to be contacted as frequently. The patient has been provided with contact information for the care management team and has been advised to call with any health related questions or concerns.     Subjective: Andrew [name] is an 66 y.o. year old male who is a primary patient of Damita Dunnings, Elveria Rising, MD.  The CCM team was consulted for assistance with disease management and care coordination needs.    Engaged with patient by telephone for initial visit in response to provider referral for pharmacy case management and/or care coordination services.   Consent to Services:  The patient was given the following information about Chronic Care Management services today, agreed to services, and gave verbal consent: 1. CCM service includes personalized support from designated clinical staff supervised by the primary care provider, including individualized plan of care and coordination with other care providers 2. 24/7 contact phone numbers for assistance for urgent and routine care needs. 3. Service will only be billed when office clinical staff spend 20 minutes or more in a month to coordinate care. 4. Only one practitioner may furnish and bill the service in a calendar month. 5.The patient may stop CCM services at any time (effective at the end of the month) by phone call to the office staff. 6. The patient will be responsible for cost sharing (co-pay) of up to 20% of the service fee (after annual  deductible is met). Patient agreed to services and consent obtained.  Patient Care Team: Tonia Ghent, MD as PCP - Kela Millin, MD as Consulting Physician (Orthopedic Surgery) Erline Levine, MD as Consulting Physician (Neurosurgery) Charlton Haws, [facility] as Pharmacist (Pharmacist)  Recent office visits: 05/15/22 Dr Damita Dunnings OV: annual - fatigue/myalgias, unclear if from statin. Hold pravastatin for 2 weeks and update. Rx'd methocarbamol and tramadol.  Recent consult visits: [facility] visits: Admitted to the ED on 04/28/22. Discharge date was 04/28/22.  Discharged from [facility].   Discharge diagnosis (Principal Problem): Contusion of rib on left side Patient was discharged to Home   Brief summary of hospital course: Afebrile, nontoxic-appearing 66 year old male presents the emergency department for evaluation of fall that occurred over the weekend. Initial vitals show moderate hypertension 162/94, nontachycardic, afebrile, oxygenating adequately room air. Physical exam does show area of ecchymosis noted to the inferior, posterior left rib cage without obvious step-offs or bony deformities. Lungs clear to auscultation bilaterally. Patient is overall well-appearing, found seated comfortably no acute distress, alert and oriented, GCS 15. Will give Lidoderm patch, Norflex. Patient does have a ride home and did take analgesia prior to arrival although notes that was expired and possibly ineffective. Will order plain films to evaluate for acute osseous abnormalities, acute cardiopulmonary pathology or rib fractures. Plain films unremarkable for acute findings. No pneumothorax or acute cardiopulmonary pathology. Low suspicion for intraabdominal/organ injury given physical exam and history. Do suspect contusion/other soft tissue injury. Discussed analgesia options as well as symptomatic relief with the patient is  agreeable to trial muscle relaxers and Toradol.  Recommend against driving or operating heavy machinery while taking muscle relaxers. Discussed imaging results with patient who is agreeable to discharge at this time. I have independently reviewed and interpreted the images ordered. Discussed symptoms warranting return to the emergency department. Discussed at home symptomatic treatment as well as appropriate follow up. Patient advised to return for new or worsening symptoms.   New?Medications Started at [facility] Discharge:?? -Started ketorolac (TORADOL) 10 mg tablet    -Started lidocaine (LIDODERM) 5 % patch   -Started methocarbamoL (ROBAXIN) 500 mg tablet     Medications that remain the same after Hospital Discharge:??  -All other medications will remain the same.     Objective:  Lab Results  Component Value Date   CREATININE 0.82 05/07/2022   BUN 16 05/07/2022   GFR 92.59 05/07/2022   GFRNONAA 105.99 05/13/2010   GFRAA  03/15/2009    >60        The eGFR has been calculated using the MDRD equation. This calculation has not been validated in all clinical situations. eGFR's persistently <60 mL/min signify possible Chronic Kidney Disease.   NA 140 05/07/2022   K 4.9 05/07/2022   CALCIUM 9.4 05/07/2022   CO2 27 05/07/2022   GLUCOSE 106 (H) 05/07/2022    Lab Results  Component Value Date/Time   HGBA1C 5.9 05/07/2022 07:22 AM   HGBA1C 5.7 05/08/2021 07:40 AM   GFR 92.59 05/07/2022 07:22 AM   GFR 91.91 05/08/2021 07:40 AM   MICROALBUR 0.6 04/19/2008 10:12 AM    Last diabetic Eye exam: No results found for: "HMDIABEYEEXA"  Last diabetic Foot exam: No results found for: "HMDIABFOOTEX"   Lab Results  Component Value Date   CHOL 164 05/07/2022   HDL 43.90 05/07/2022   LDLCALC 101 (H) 05/07/2022   TRIG 93.0 05/07/2022   CHOLHDL 4 05/07/2022       Latest Ref Rng & Units 05/07/2022    7:22 AM 05/08/2021    7:40 AM 05/09/2020    7:38 AM  Hepatic Function  Total Protein 6.0 - 8.3 g/dL 7.2  7.1  6.7   Albumin 3.5 - 5.2  g/dL 4.9  4.6  4.4   AST 0 - 37 U/L '20  19  21   ' ALT 0 - 53 U/L 31  28  34   Alk Phosphatase 39 - 117 U/L 59  54  51   Total Bilirubin 0.2 - 1.2 mg/dL 0.6  0.6  0.4     Lab Results  Component Value Date/Time   TSH 1.24 05/15/2022 10:54 AM   TSH 1.21 04/24/2009 09:23 AM       Latest Ref Rng & Units 05/15/2022   10:54 AM 08/12/2019   10:30 AM 06/13/2015    2:38 PM  CBC  WBC 4.0 - 10.5 K/uL 5.8  6.9  11.1   Hemoglobin 13.0 - 17.0 g/dL 13.7  16.2  15.6   Hematocrit 39.0 - 52.0 % 40.2  46.4  46.3   Platelets 150.0 - 400.0 K/uL 145.0  153  115.0     Lab Results  Component Value Date/Time   VD25OH 26 (L) 04/24/2009 10:04 PM    Clinical ASCVD: No  The 10-year ASCVD risk score (Arnett DK, et al., 2019) is: 15.2%   Values used to calculate the score:     Age: 67 years     Sex: Male     Is Non-Hispanic African American: No     Diabetic:  No     Tobacco smoker: Yes     Systolic Blood Pressure: 875 mmHg     Is BP treated: No     HDL Cholesterol: 43.9 mg/dL     Total Cholesterol: 164 mg/dL       05/14/2022    1:13 PM 05/10/2021   10:42 AM 05/09/2020    2:11 PM  Depression screen PHQ 2/9  Decreased Interest 0 0 0  Down, Depressed, Hopeless 0 0 0  PHQ - 2 Score 0 0 0  Altered sleeping 0 0 0  Tired, decreased energy 0 0 0  Change in appetite 0 0 0  Feeling bad or failure about yourself  0 0 0  Trouble concentrating 0 0 0  Moving slowly or fidgety/restless 0 0 0  Suicidal thoughts 0 0 0  PHQ-9 Score 0 0 0  Difficult doing work/chores Not difficult at all Not difficult at all Not difficult at all     Social History   Tobacco Use  Smoking Status Former  Smokeless Tobacco Former   BP Readings from Last 3 Encounters:  05/15/22 122/74  05/13/21 128/74  05/10/20 126/78   Pulse Readings from Last 3 Encounters:  05/15/22 87  05/13/21 75  05/10/20 95   Wt Readings from Last 3 Encounters:  05/15/22 221 lb (100.2 kg)  05/14/22 216 lb (98 kg)  05/13/21 216 lb (98 kg)    BMI Readings from Last 3 Encounters:  05/15/22 26.90 kg/m  05/14/22 26.29 kg/m  05/13/21 26.29 kg/m    Assessment/Interventions: Review of patient past medical history, allergies, medications, health status, including review of consultants reports, laboratory and other test data, was performed as part of comprehensive evaluation and provision of chronic care management services.   SDOH:  (Social Determinants of Health) assessments and interventions performed: No SDOH Interventions    Flowsheet Row Clinical Support from 05/14/2022 in [place] at [place] from 05/10/2021 in [place] at [place] from 05/09/2020 in [place] at [place] from 04/13/2019 in [place] at [place] Interventions Intervention Not Indicated -- -- --  Housing Interventions Intervention Not Indicated -- -- --  Transportation Interventions Intervention Not Indicated -- -- --  Depression Interventions/Treatment  -- PHQ2-9 Score <4 Follow-up Not Indicated PHQ2-9 Score <4 Follow-up Not Indicated PHQ2-9 Score <4 Follow-up Not Indicated  Financial Strain Interventions Intervention Not Indicated -- -- --  Physical Activity Interventions Intervention Not Indicated -- -- --  Stress Interventions Intervention Not Indicated -- -- --  Social Connections Interventions Intervention Not Indicated -- -- --      SDOH Screenings   Food Insecurity: No Food Insecurity (05/14/2022)  Housing: [place]  (05/14/2022)  Transportation Needs: No Transportation Needs (05/14/2022)  Alcohol Screen: Low Risk  (05/14/2022)  Depression (PHQ2-9): Low Risk  (05/14/2022)  Financial Resource Strain: Low Risk  (05/14/2022)  Physical Activity: Insufficiently Active (05/14/2022)  Social Connections: Moderately Integrated (05/14/2022)  Stress: No Stress Concern Present (05/14/2022)  Tobacco Use: Medium Risk  (05/15/2022)    CCM Care Plan  Allergies  Allergen Reactions   Lyrica [Pregabalin]     No relief of pain   Neurontin [Gabapentin]     No relief of pain   Atorvastatin Calcium Other (See Comments)    Joint aches   Escitalopram Oxalate Anxiety    SLEEPLESSNESS   Pravastatin Sodium Other (See Comments)    fatigue  Sertraline Hcl Anxiety    Medications Reviewed Today     Reviewed by Charlton Haws, [facility] (Pharmacist) on 07/16/22 at 1522  Med List Status: <None>   Medication Order Taking? Sig Documenting Provider Last Dose Status Informant  acetaminophen (TYLENOL) 650 MG CR tablet [id number] Yes Take 1,300 mg by mouth every 8 (eight) hours as needed for pain. [provider] Taking Active Self  celecoxib (CELEBREX) 100 MG capsule [id number] Yes Take 1 capsule (100 mg total) by mouth 2 (two) times daily. Tonia Ghent, MD Taking Active   docusate sodium (COLACE) 50 MG capsule [id number] Yes Take 50 mg by mouth 2 (two) times daily as needed for mild constipation. [provider] Taking Active Self  Evolocumab (REPATHA SURECLICK) 518 MG/ML Darden Palmer [id number] Yes Inject 140 mg into the skin every 14 (fourteen) days. Tonia Ghent, MD Taking Active   methocarbamol (ROBAXIN) 500 MG tablet [id number] Yes Take 1 tablet (500 mg total) by mouth every 8 (eight) hours as needed for muscle spasms. Tonia Ghent, MD Taking Active   Omega-3 Fatty Acids (FISH OIL) 1000 MG CAPS [id number] Yes Take 1,000 mg by mouth daily. [provider] Taking Active Self           Med Note Damita Dunnings, Iverson Alamin May 10, 2020  8:30 AM)    omeprazole (PRILOSEC) 20 MG capsule [id number] Yes Take 20 mg by mouth as needed. [provider] Taking Active   traMADol (ULTRAM) 50 MG tablet [id number] Yes Take 1 tablet (50 mg total) by mouth every 8 (eight) hours as needed. Tonia Ghent, MD Taking Active             Patient Active Problem List   Diagnosis Date Noted   Other  fatigue 05/18/2022   Lumbar foraminal stenosis 08/16/2019   Health care maintenance 04/08/2018   Advance care planning 06/01/2014   Insomnia 05/27/2013   Erectile dysfunction 01/06/2013   Medicare annual wellness visit, subsequent 05/21/2012   Back pain 03/15/2011   HYPERCHOLESTEROLEMIA (297 LDL 210) 04/22/2007   Hearing loss 04/18/2007   Hyperglycemia 04/18/2007    Immunization History  Administered Date(s) Administered   Influenza Whole 07/20/2013   Influenza, Seasonal, Injecte, Preservative Fre 08/02/2021   Influenza,inj,Quad PF,6+ Mos 07/13/2017, 07/26/2018, 07/21/2019, 07/24/2020   Influenza-Unspecified 07/13/2017, 04/25/2018, 07/26/2018, 07/21/2019   Moderna Sars-Covid-2 Vaccination 11/23/2019, 12/22/2019, 08/16/2020   Td 01/02/2003   Tdap 05/27/2013   Zoster Recombinat (Shingrix) 07/24/2020, 09/23/2020    Conditions to be addressed/monitored:  Hyperlipidemia and back pain  Care Plan : [place]  Updates made by Charlton Haws, [place] since 07/16/2022 12:00 AM     Problem: Hyperlipidemia and back pain   Priority: High     Long-Range Goal: Disease mgmt   Start Date: 06/19/2022  Expected End Date: 12/18/2022  This Visit's Progress: On track  Recent Progress: On track  Priority: High  Note:   Current Barriers:  Unable to achieve control of HLD   Pharmacist Clinical Goal(s):  Patient will adhere to plan to optimize therapeutic regimen for HLD as evidenced by report of adherence to recommended medication management changes through collaboration with PharmD and provider.   Interventions: 1:1 collaboration with Tonia Ghent, MD regarding development and update of comprehensive plan of care as evidenced by provider attestation and co-signature Inter-disciplinary care team collaboration (see longitudinal plan of care) Comprehensive medication review performed; medication list updated in electronic medical record  Hyperlipidemia: (LDL goal <  70) -Not ideally controlled - LDL 101 (04/2022) while on pravastatin, highest LDL on record was 176 (2018) while off statins; pt started Repatha recently and reports compliance, denies side effects -ASCVD risk 14.4% -Current treatment: Fish oil 1000 mg - Appropriate, Effective, Safe, Accessible Repatha 140 mg inj q 14 days (started 9/4) - Appropriate, Query Effective -Medications previously tried: atorvastatin 20 mg (joint aches), pravastatin 20 mg (fatigue, stopped 04/2022) -Educated on cholesterol goals - discussed ideal goal < 70 for lowest risk of ASCVD, pt agrees he would like to achieve this goal.  -Recommend to continue current medication; Re-check lipids in 6-8 weeks after initiation  Back pain (Goal: manage pain) -Controlled -With spinal cord stimulator. S/p lumbar fusion, diskectomy. Prev seen by Dr. Vertell Limber with input on medical mgmt by [facility] -Current treatment  Celecoxib 100 mg BID - Appropriate, Effective, Safe, Accessible Methocarbamol 500 mg -Appropriate, Effective, Safe, Accessible Tramadol 50 mg -Appropriate, Effective, Safe, Accessible Tylenol 650 mg -Appropriate, Effective, Safe, Accessible -Medications previously tried: n/a  -Recommended to continue current medication  Health Maintenance -Vaccine gaps: none  Patient Goals/Self-Care Activities Patient will:  - take medications as prescribed as evidenced by patient report and record review focus on medication adherence by routine     Medication Assistance: None required.  Patient affirms current coverage meets needs.  Compliance/Adherence/Medication fill history: Care Gaps: None  Star-Rating Drugs: None  Medication Access: Within the past 30 days, how often has patient missed a dose of medication? 0 Is a pillbox or other method used to improve adherence? No  Factors that may affect medication adherence? adverse effects of medications Are meds synced by current pharmacy? No  Are meds delivered  by current pharmacy? Yes  Does patient experience delays in picking up medications due to transportation concerns? No   [facility]stream Services Reviewed: Is patient disadvantaged to use [facility]?: Yes  Current Rx insurance plan: [facility] Name and location of Current pharmacy:  [facility] [place], [place] [place] [address] [place] [address] Phone: [phone] Fax: [phone]  [facility] ([facility]) - [place], [address] [address] Phone: [phone] Fax: [phone]  [facility] services reviewed with patient today?: No  Patient requests to transfer care to [facility] Pharmacy?: No  Reason patient declined to change pharmacies: Disadvantaged due to insurance/mail order and Not mentioned at this visit   Care Plan and Follow Up Patient Decision:  Patient agrees to Care Plan and Follow-up.  Plan: The patient has been provided with contact information for the care management team and has been advised to call with any health related questions or concerns.   [name], PharmD, BCACP Clinical Pharmacist [facility] [facility] at [facility] [phone]

## 2022-07-16 NOTE — Addendum Note (Signed)
Addended by: Tonia Ghent on: 07/16/2022 03:45 PM   Modules accepted: Orders

## 2022-07-16 NOTE — Telephone Encounter (Signed)
Spoke with patient, he wanted to get labwork done here. Scheduled lab appt for 11/15 (~2 months out from 1st dose of Repatha). Orders needed.

## 2022-07-16 NOTE — Telephone Encounter (Signed)
Ordered. Thanks

## 2022-07-16 NOTE — Patient Instructions (Addendum)
Visit Information  Phone number for Pharmacist: [phone]   Goals Addressed   None     Care Plan : [place]  Updates made by Charlton Haws, RPH since 07/16/2022 12:00 AM     Problem: Hyperlipidemia and back pain   Priority: High     Long-Range Goal: Disease mgmt   Start Date: 06/19/2022  Expected End Date: 12/18/2022  This Visit's Progress: On track  Recent Progress: On track  Priority: High  Note:   Current Barriers:  Unable to achieve control of HLD   Pharmacist Clinical Goal(s):  Patient will adhere to plan to optimize therapeutic regimen for HLD as evidenced by report of adherence to recommended medication management changes through collaboration with PharmD and provider.   Interventions: 1:1 collaboration with Tonia Ghent, MD regarding development and update of comprehensive plan of care as evidenced by provider attestation and co-signature Inter-disciplinary care team collaboration (see longitudinal plan of care) Comprehensive medication review performed; medication list updated in electronic medical record  Hyperlipidemia: (LDL goal < 70) -Not ideally controlled - LDL 101 (04/2022) while on pravastatin, highest LDL on record was 176 (2018) while off statins; pt started Repatha recently and reports compliance, denies side effects -ASCVD risk 14.4% -Current treatment: Fish oil 1000 mg - Appropriate, Effective, Safe, Accessible Repatha 140 mg inj q 14 days (started 9/4) - Appropriate, Query Effective -Medications previously tried: atorvastatin 20 mg (joint aches), pravastatin 20 mg (fatigue, stopped 04/2022) -Educated on cholesterol goals - discussed ideal goal < 70 for lowest risk of ASCVD, pt agrees he would like to achieve this goal.  -Recommend to continue current medication; Re-check lipids in 6-8 weeks after initiation  Back pain (Goal: manage pain) -Controlled -With spinal cord stimulator. S/p lumbar fusion, diskectomy. Prev seen by  Dr. Vertell Limber with input on medical mgmt by [facility] -Current treatment  Celecoxib 100 mg BID - Appropriate, Effective, Safe, Accessible Methocarbamol 500 mg -Appropriate, Effective, Safe, Accessible Tramadol 50 mg -Appropriate, Effective, Safe, Accessible Tylenol 650 mg -Appropriate, Effective, Safe, Accessible -Medications previously tried: n/a  -Recommended to continue current medication  Health Maintenance -Vaccine gaps: none  Patient Goals/Self-Care Activities Patient will:  - take medications as prescribed as evidenced by patient report and record review focus on medication adherence by routine      Patient verbalizes understanding of instructions and care plan provided today and agrees to view in [place]. Active MyChart status and patient understanding of how to access instructions and care plan via MyChart confirmed with patient.    The patient has been provided with contact information for the care management team and has been advised to call with any health related questions or concerns.   [name], PharmD, BCACP Clinical Pharmacist [place] [facility] at [facility] [phone]

## 2022-07-19 DIAGNOSIS — E7800 Pure hypercholesterolemia, unspecified: Secondary | ICD-10-CM

## 2022-07-28 ENCOUNTER — Encounter: Payer: Self-pay | Admitting: Family Medicine

## 2022-08-13 ENCOUNTER — Encounter: Payer: Self-pay | Admitting: Family Medicine

## 2022-09-03 ENCOUNTER — Other Ambulatory Visit (INDEPENDENT_AMBULATORY_CARE_PROVIDER_SITE_OTHER): Payer: Medicare Other

## 2022-09-03 DIAGNOSIS — E7800 Pure hypercholesterolemia, unspecified: Secondary | ICD-10-CM | POA: Diagnosis not present

## 2022-09-03 LAB — LIPID PANEL
Cholesterol: 115 mg/dL (ref 0–200)
HDL: 45.9 mg/dL (ref >39.00)
LDL Cholesterol: 52 mg/dL (ref 0–99)
NonHDL: 68.61
Total CHOL/HDL Ratio: 2
Triglycerides: 84 mg/dL (ref 0.0–149.0)
VLDL: 16.8 mg/dL (ref 0.0–40.0)

## 2022-09-08 ENCOUNTER — Other Ambulatory Visit: Payer: Self-pay | Admitting: Family Medicine

## 2022-09-08 DIAGNOSIS — E7800 Pure hypercholesterolemia, unspecified: Secondary | ICD-10-CM

## 2022-10-24 ENCOUNTER — Encounter: Payer: Self-pay | Admitting: Family Medicine

## 2022-10-27 NOTE — Telephone Encounter (Signed)
Called and spoke to patient and was advised that he is feeling some better today. Offered patient an appointment which he declined. Patient stated that Dr. Damita Dunnings has always responded to him when he sends messages like this in. Patient was advised that typically when messages come in after business hours they are not reviewed until the office is opened again.Patient stated that he has moved and now lives about 1 and 1/2 hours away. Patient stated that he just wants Dr. Josefine Class input regarding his symptoms.

## 2022-11-14 ENCOUNTER — Other Ambulatory Visit: Payer: Self-pay | Admitting: Family Medicine

## 2022-11-14 NOTE — Telephone Encounter (Signed)
Covermymeds request for a Medication PA for Repatha [employer]. (Key: [id number])- This medication or product was previously approved on [id number] from 10-20-2022 to 10-20-2023. MyChart message sent to the patient.

## 2023-04-27 ENCOUNTER — Other Ambulatory Visit: Payer: Self-pay | Admitting: Family Medicine

## 2023-04-27 DIAGNOSIS — R739 Hyperglycemia, unspecified: Secondary | ICD-10-CM

## 2023-04-27 DIAGNOSIS — E7800 Pure hypercholesterolemia, unspecified: Secondary | ICD-10-CM

## 2023-04-27 DIAGNOSIS — Z125 Encounter for screening for malignant neoplasm of prostate: Secondary | ICD-10-CM

## 2023-05-06 ENCOUNTER — Other Ambulatory Visit (INDEPENDENT_AMBULATORY_CARE_PROVIDER_SITE_OTHER): Payer: Medicare Other

## 2023-05-06 DIAGNOSIS — R739 Hyperglycemia, unspecified: Secondary | ICD-10-CM

## 2023-05-06 DIAGNOSIS — Z125 Encounter for screening for malignant neoplasm of prostate: Secondary | ICD-10-CM | POA: Diagnosis not present

## 2023-05-06 DIAGNOSIS — E7800 Pure hypercholesterolemia, unspecified: Secondary | ICD-10-CM | POA: Diagnosis not present

## 2023-05-06 LAB — LIPID PANEL
Cholesterol: 131 mg/dL (ref 0–200)
HDL: 46 mg/dL (ref >39.00)
LDL Cholesterol: 67 mg/dL (ref 0–99)
NonHDL: 85.09
Total CHOL/HDL Ratio: 3
Triglycerides: 92 mg/dL (ref 0.0–149.0)
VLDL: 18.4 mg/dL (ref 0.0–40.0)

## 2023-05-06 LAB — COMPREHENSIVE METABOLIC PANEL
ALT: 27 U/L (ref 0–53)
AST: 19 U/L (ref 0–37)
Albumin: 4.4 g/dL (ref 3.5–5.2)
Alkaline Phosphatase: 60 U/L (ref 39–117)
BUN: 18 mg/dL (ref 6–23)
CO2: 27 mEq/L (ref 19–32)
Calcium: 9.4 mg/dL (ref 8.4–10.5)
Chloride: 106 mEq/L (ref 96–112)
Creatinine, Ser: 0.83 mg/dL (ref 0.40–1.50)
GFR: 91.61 mL/min (ref >60.00)
Glucose, Bld: 112 mg/dL — ABNORMAL HIGH (ref 70–99)
Potassium: 4.8 mEq/L (ref 3.5–5.1)
Sodium: 141 mEq/L (ref 135–145)
Total Bilirubin: 0.5 mg/dL (ref 0.2–1.2)
Total Protein: 6.7 g/dL (ref 6.0–8.3)

## 2023-05-06 LAB — CBC WITH DIFFERENTIAL/PLATELET
Basophils Absolute: 0 10*3/uL (ref 0.0–0.1)
Basophils Relative: 0.6 % (ref 0.0–3.0)
Eosinophils Absolute: 0.2 10*3/uL (ref 0.0–0.7)
Eosinophils Relative: 3 % (ref 0.0–5.0)
HCT: 43.8 % (ref 39.0–52.0)
Hemoglobin: 14.2 g/dL (ref 13.0–17.0)
Lymphocytes Relative: 32.2 % (ref 12.0–46.0)
Lymphs Abs: 1.9 10*3/uL (ref 0.7–4.0)
MCHC: 32.4 g/dL (ref 30.0–36.0)
MCV: 84.8 fl (ref 78.0–100.0)
Monocytes Absolute: 0.6 10*3/uL (ref 0.1–1.0)
Monocytes Relative: 9.4 % (ref 3.0–12.0)
Neutro Abs: 3.3 10*3/uL (ref 1.4–7.7)
Neutrophils Relative %: 54.8 % (ref 43.0–77.0)
Platelets: 140 10*3/uL — ABNORMAL LOW (ref 150.0–400.0)
RBC: 5.17 Mil/uL (ref 4.22–5.81)
RDW: 16.9 % — ABNORMAL HIGH (ref 11.5–15.5)
WBC: 6 10*3/uL (ref 4.0–10.5)

## 2023-05-06 LAB — PSA, MEDICARE: PSA: 0.19 ng/ml (ref 0.10–4.00)

## 2023-05-06 LAB — HEMOGLOBIN A1C: Hgb A1c MFr Bld: 6.4 % (ref 4.6–6.5)

## 2023-05-06 LAB — TSH: TSH: 1.47 u[IU]/mL (ref 0.35–5.50)

## 2023-05-13 ENCOUNTER — Other Ambulatory Visit: Payer: Medicare Other

## 2023-05-18 ENCOUNTER — Ambulatory Visit (INDEPENDENT_AMBULATORY_CARE_PROVIDER_SITE_OTHER): Payer: Medicare Other

## 2023-05-18 VITALS — Ht 77.0 in | Wt 225.0 lb

## 2023-05-18 DIAGNOSIS — Z0000 Encounter for general adult medical examination without abnormal findings: Secondary | ICD-10-CM

## 2023-05-18 NOTE — Patient Instructions (Signed)
Andrew [name] , Thank you for taking time to come for your Medicare Wellness Visit. I appreciate your ongoing commitment to your health goals. Please review the following plan we discussed and let me know if I can assist you in the future.   Referrals/Orders/Follow-Ups/Clinician Recommendations: Aim for 30 minutes of exercise or brisk walking, 6-8 glasses of water, and 5 servings of fruits and vegetables each day.   Managing Pain Without Opioids Opioids are strong medicines used to treat moderate to severe pain. For some people, especially those who have long-term (chronic) pain, opioids may not be the best choice for pain management due to: Side effects like nausea, constipation, and sleepiness. The risk of addiction (opioid use disorder). The longer you take opioids, the greater your risk of addiction. Pain that lasts for more than 3 months is called chronic pain. Managing chronic pain usually requires more than one approach and is often provided by a team of health care providers working together (multidisciplinary approach). Pain management may be done at a pain management center or pain clinic. How to manage pain without the use of opioids Use non-opioid medicines Non-opioid medicines for pain may include: Over-the-counter or prescription non-steroidal anti-inflammatory drugs (NSAIDs). These may be the first medicines used for pain. They work well for muscle and bone pain, and they reduce swelling. Acetaminophen. This over-the-counter medicine may work well for milder pain but not swelling. Antidepressants. These may be used to treat chronic pain. A certain type of antidepressant (tricyclics) is often used. These medicines are given in lower doses for pain than when used for depression. Anticonvulsants. These are usually used to treat seizures but may also reduce nerve (neuropathic) pain. Muscle relaxants. These relieve pain caused by sudden muscle tightening (spasms). You may also use a pain  medicine that is applied to the skin as a patch, cream, or gel (topical analgesic), such as a numbing medicine. These may cause fewer side effects than medicines taken by mouth. Do certain therapies as directed Some therapies can help with pain management. They include: Physical therapy. You will do exercises to gain strength and flexibility. A physical therapist may teach you exercises to move and stretch parts of your body that are weak, stiff, or painful. You can learn these exercises at physical therapy visits and practice them at home. Physical therapy may also involve: Massage. Heat wraps or applying heat or cold to affected areas. Electrical signals that interrupt pain signals (transcutaneous electrical nerve stimulation, TENS). Weak lasers that reduce pain and swelling (low-level laser therapy). Signals from your body that help you learn to regulate pain (biofeedback). Occupational therapy. This helps you to learn ways to function at home and work with less pain. Recreational therapy. This involves trying new activities or hobbies, such as a physical activity or drawing. Mental health therapy, including: Cognitive behavioral therapy (CBT). This helps you learn coping skills for dealing with pain. Acceptance and commitment therapy (ACT) to change the way you think and react to pain. Relaxation therapies, including muscle relaxation exercises and mindfulness-based stress reduction. Pain management counseling. This may be individual, family, or group counseling.  Receive medical treatments Medical treatments for pain management include: Nerve block injections. These may include a pain blocker and anti-inflammatory medicines. You may have injections: Near the spine to relieve chronic back or neck pain. Into joints to relieve back or joint pain. Into nerve areas that supply a painful area to relieve body pain. Into muscles (trigger point injections) to relieve some painful muscle  conditions. A medical device placed near your spine to help block pain signals and relieve nerve pain or chronic back pain (spinal cord stimulation device). Acupuncture. Follow these instructions at home Medicines Take over-the-counter and prescription medicines only as told by your health care provider. If you are taking pain medicine, ask your health care providers about possible side effects to watch out for. Do not drive or use heavy machinery while taking prescription opioid pain medicine. Lifestyle  Do not use drugs or alcohol to reduce pain. If you drink alcohol, limit how much you have to: 0-1 drink a day for women who are not pregnant. 0-2 drinks a day for men. Know how much alcohol is in a drink. In the [place] one drink equals one 12 oz bottle of beer (355 mL), one 5 oz glass of wine (148 mL), or one 1 oz glass of hard liquor (44 mL). Do not use any products that contain nicotine or tobacco. These products include cigarettes, chewing tobacco, and vaping devices, such as e-cigarettes. If you need help quitting, ask your health care provider. Eat a healthy diet and maintain a healthy weight. Poor diet and excess weight may make pain worse. Eat foods that are high in fiber. These include fresh fruits and vegetables, whole grains, and beans. Limit foods that are high in fat and processed sugars, such as fried and sweet foods. Exercise regularly. Exercise lowers stress and may help relieve pain. Ask your health care provider what activities and exercises are safe for you. If your health care provider approves, join an exercise class that combines movement and stress reduction. Examples include yoga and tai chi. Get enough sleep. Lack of sleep may make pain worse. Lower stress as much as possible. Practice stress reduction techniques as told by your therapist. General instructions Work with all your pain management providers to find the treatments that work best for you. You are an  important member of your pain management team. There are many things you can do to reduce pain on your own. Consider joining an online or in-person support group for people who have chronic pain. Keep all follow-up visits. This is important. Where to find more information You can find more information about managing pain without opioids from: American Academy of Pain Medicine: painmed.org Institute for Chronic Pain: instituteforchronicpain.org American Chronic Pain Association: theacpa.org Contact a health care provider if: You have side effects from pain medicine. Your pain gets worse or does not get better with treatments or home therapy. You are struggling with anxiety or depression. Summary Many types of pain can be managed without opioids. Chronic pain may respond better to pain management without opioids. Pain is best managed when you and a team of health care providers work together. Pain management without opioids may include non-opioid medicines, medical treatments, physical therapy, mental health therapy, and lifestyle changes. Tell your health care providers if your pain gets worse or is not being managed well enough. This information is not intended to replace advice given to you by your health care provider. Make sure you discuss any questions you have with your health care provider. Document Revised: 01/16/2021 Document Reviewed: 01/16/2021 Elsevier Patient Education  2024 [employer].   This is a list of the screening recommended for you and due dates:  Health Maintenance  Topic Date Due   COVID-19 Vaccine (5 - 2023-24 season) 10/08/2022   Medicare Annual Wellness Visit  05/15/2023   Flu Shot  05/21/2023   DTaP/Tdap/Td vaccine (3 - Td  or Tdap) 05/28/2023   Colon Cancer Screening  01/23/2025   Pneumonia Vaccine  Completed   Hepatitis C Screening  Completed   HIV Screening  Completed   Zoster (Shingles) Vaccine  Completed   HPV Vaccine  Aged Out    Advanced  directives: copy in chart  Next Medicare Annual Wellness Visit scheduled for next year: Yes  Preventive Care 62 Years and Older, Male  Preventive care refers to lifestyle choices and visits with your health care provider that can promote health and wellness. What does preventive care include? A yearly physical exam. This is also called an annual well check. Dental exams once or twice a year. Routine eye exams. Ask your health care provider how often you should have your eyes checked. Personal lifestyle choices, including: Daily care of your teeth and gums. Regular physical activity. Eating a healthy diet. Avoiding tobacco and drug use. Limiting alcohol use. Practicing safe sex. Taking low doses of aspirin every day. Taking vitamin and mineral supplements as recommended by your health care provider. What happens during an annual well check? The services and screenings done by your health care provider during your annual well check will depend on your age, overall health, lifestyle risk factors, and family history of disease. Counseling  Your health care provider may ask you questions about your: Alcohol use. Tobacco use. Drug use. Emotional well-being. Home and relationship well-being. Sexual activity. Eating habits. History of falls. Memory and ability to understand (cognition). Work and work Astronomer. Screening  You may have the following tests or measurements: Height, weight, and BMI. Blood pressure. Lipid and cholesterol levels. These may be checked every 5 years, or more frequently if you are over 78 years old. Skin check. Lung cancer screening. You may have this screening every year starting at age 52 if you have a 30-pack-year history of smoking and currently smoke or have quit within the past 15 years. Fecal occult blood test (FOBT) of the stool. You may have this test every year starting at age 52. Flexible sigmoidoscopy or colonoscopy. You may have a  sigmoidoscopy every 5 years or a colonoscopy every 10 years starting at age 25. Prostate cancer screening. Recommendations will vary depending on your family history and other risks. Hepatitis C blood test. Hepatitis B blood test. Sexually transmitted disease (STD) testing. Diabetes screening. This is done by checking your blood sugar (glucose) after you have not eaten for a while (fasting). You may have this done every 1-3 years. Abdominal aortic aneurysm (AAA) screening. You may need this if you are a current or former smoker. Osteoporosis. You may be screened starting at age 91 if you are at high risk. Talk with your health care provider about your test results, treatment options, and if necessary, the need for more tests. Vaccines  Your health care provider may recommend certain vaccines, such as: Influenza vaccine. This is recommended every year. Tetanus, diphtheria, and acellular pertussis (Tdap, Td) vaccine. You may need a Td booster every 10 years. Zoster vaccine. You may need this after age 30. Pneumococcal 13-valent conjugate (PCV13) vaccine. One dose is recommended after age 67. Pneumococcal polysaccharide (PPSV23) vaccine. One dose is recommended after age 37. Talk to your health care provider about which screenings and vaccines you need and how often you need them. This information is not intended to replace advice given to you by your health care provider. Make sure you discuss any questions you have with your health care provider. Document Released: 11/02/2015 Document Revised: 06/25/2016  Document Reviewed: 08/07/2015 Elsevier Interactive Patient Education  2017 [employer].  Fall Prevention in the Home Falls can cause injuries. They can happen to people of all ages. There are many things you can do to make your home safe and to help prevent falls. What can I do on the outside of my home? Regularly fix the edges of walkways and driveways and fix any cracks. Remove anything  that might make you trip as you walk through a door, such as a raised step or threshold. Trim any bushes or trees on the path to your home. Use bright outdoor lighting. Clear any walking paths of anything that might make someone trip, such as rocks or tools. Regularly check to see if handrails are loose or broken. Make sure that both sides of any steps have handrails. Any raised decks and porches should have guardrails on the edges. Have any leaves, snow, or ice cleared regularly. Use sand or salt on walking paths during winter. Clean up any spills in your garage right away. This includes oil or grease spills. What can I do in the bathroom? Use night lights. Install grab bars by the toilet and in the tub and shower. Do not use towel bars as grab bars. Use non-skid mats or decals in the tub or shower. If you need to sit down in the shower, use a plastic, non-slip stool. Keep the floor dry. Clean up any water that spills on the floor as soon as it happens. Remove soap buildup in the tub or shower regularly. Attach bath mats securely with double-sided non-slip rug tape. Do not have throw rugs and other things on the floor that can make you trip. What can I do in the bedroom? Use night lights. Make sure that you have a light by your bed that is easy to reach. Do not use any sheets or blankets that are too big for your bed. They should not hang down onto the floor. Have a firm chair that has side arms. You can use this for support while you get dressed. Do not have throw rugs and other things on the floor that can make you trip. What can I do in the kitchen? Clean up any spills right away. Avoid walking on wet floors. Keep items that you use a lot in easy-to-reach places. If you need to reach something above you, use a strong step stool that has a grab bar. Keep electrical cords out of the way. Do not use floor polish or wax that makes floors slippery. If you must use wax, use non-skid floor  wax. Do not have throw rugs and other things on the floor that can make you trip. What can I do with my stairs? Do not leave any items on the stairs. Make sure that there are handrails on both sides of the stairs and use them. Fix handrails that are broken or loose. Make sure that handrails are as long as the stairways. Check any carpeting to make sure that it is firmly attached to the stairs. Fix any carpet that is loose or worn. Avoid having throw rugs at the top or bottom of the stairs. If you do have throw rugs, attach them to the floor with carpet tape. Make sure that you have a light switch at the top of the stairs and the bottom of the stairs. If you do not have them, ask someone to add them for you. What else can I do to help prevent falls? Wear shoes that:  Do not have high heels. Have rubber bottoms. Are comfortable and fit you well. Are closed at the toe. Do not wear sandals. If you use a stepladder: Make sure that it is fully opened. Do not climb a closed stepladder. Make sure that both sides of the stepladder are locked into place. Ask someone to hold it for you, if possible. Clearly mark and make sure that you can see: Any grab bars or handrails. First and last steps. Where the edge of each step is. Use tools that help you move around (mobility aids) if they are needed. These include: Canes. Walkers. Scooters. Crutches. Turn on the lights when you go into a dark area. Replace any light bulbs as soon as they burn out. Set up your furniture so you have a clear path. Avoid moving your furniture around. If any of your floors are uneven, fix them. If there are any pets around you, be aware of where they are. Review your medicines with your doctor. Some medicines can make you feel dizzy. This can increase your chance of falling. Ask your doctor what other things that you can do to help prevent falls. This information is not intended to replace advice given to you by your  health care provider. Make sure you discuss any questions you have with your health care provider. Document Released: 08/02/2009 Document Revised: 03/13/2016 Document Reviewed: 11/10/2014 Elsevier Interactive Patient Education  2017 [employer].

## 2023-05-18 NOTE — Progress Notes (Signed)
Subjective:   Andrew [name] is a 67 y.o. male who presents for Medicare Annual/Subsequent preventive examination.  Visit Complete: Virtual  I connected with  Valentina Gu on 05/18/23 by a audio enabled telemedicine application and verified that I am speaking with the correct person using two identifiers.  Patient Location: Home  Provider Location: Home Office  I discussed the limitations of evaluation and management by telemedicine. The patient expressed understanding and agreed to proceed.  Patient Medicare AWV questionnaire was completed by the patient on 05/14/23; I have confirmed that all information answered by patient is correct and no changes since this date.  Review of Systems      Cardiac Risk Factors include: advanced age (>26men, >73 women);dyslipidemia;male gender;sedentary lifestyle  Vital Signs: Unable to obtain new vitals due to this being a telehealth visit.     Objective:    Today's Vitals   05/14/23 0839 05/18/23 1403  Weight:  225 lb (102.1 kg)  Height:  6\' 5"  (1.956 m)  PainSc: 4     Body mass index is 26.68 kg/m.     05/18/2023    2:16 PM 05/14/2022    1:15 PM 05/10/2021   10:41 AM 05/09/2020    2:08 PM 08/12/2019   10:20 AM 04/13/2019   12:12 PM 04/07/2018    2:51 PM  Advanced Directives  Does Patient Have a Medical Advance Directive? Yes Yes Yes Yes Yes Yes Yes  Type of Estate agent of [place];Living will Healthcare Power of [place];Living will Healthcare Power of [place];Living will Healthcare Power of [place];Living will Healthcare Power of [place];Living will Healthcare Power of [place];Living will Healthcare Power of [place];Living will  Does patient want to make changes to medical advance directive? No - Patient declined Yes (Inpatient - patient defers changing a medical advance directive and declines information at this time)       Copy of Healthcare Power of Attorney in Chart? Yes - validated most recent copy  scanned in chart (See row information) Yes - validated most recent copy scanned in chart (See row information) Yes - validated most recent copy scanned in chart (See row information) No - copy requested  No - copy requested No - copy requested    Current Medications (verified) Outpatient Encounter Medications as of 05/18/2023  Medication Sig   acetaminophen (TYLENOL) 650 MG CR tablet Take 1,300 mg by mouth every 8 (eight) hours as needed for pain.   celecoxib (CELEBREX) 100 MG capsule Take 1 capsule (100 mg total) by mouth 2 (two) times daily.   methocarbamol (ROBAXIN) 500 MG tablet Take 1 tablet (500 mg total) by mouth every 8 (eight) hours as needed for muscle spasms.   Omega-3 Fatty Acids (FISH OIL) 1000 MG CAPS Take 1,000 mg by mouth daily.   omeprazole (PRILOSEC) 20 MG capsule Take 20 mg by mouth as needed.   REPATHA SURECLICK 140 MG/ML SOAJ INJECT 140MG  SUBCUTANEOUSLY  EVERY 2 WEEKS   traMADol (ULTRAM) 50 MG tablet Take 1 tablet (50 mg total) by mouth every 8 (eight) hours as needed.   docusate sodium (COLACE) 50 MG capsule Take 50 mg by mouth 2 (two) times daily as needed for mild constipation. (Patient not taking: Reported on 05/18/2023)   No facility-administered encounter medications on file as of 05/18/2023.    Allergies (verified) Lyrica [pregabalin], Neurontin [gabapentin], Atorvastatin calcium, Escitalopram oxalate, Pravastatin sodium, and Sertraline hcl   History: Past Medical History:  Diagnosis Date   Anxiety    many  years ago but situational    Arthritis    Back pain    With spinal cord stimulator.  S/p lumbar fusion, diskectomy.  Prev seen by Dr. Venetia Maxon    Complication of anesthesia    Constipation    uses Colace- he goes every 4-5 days- some hard stools    Diverticulitis    Diverticulosis    Erectile dysfunction    GERD (gastroesophageal reflux disease)    OCC    Hyperlipidemia    Leg paresthesia    L foot numb- since 1999 surgery   Neuromuscular disorder (HCC)     siatica    PONV (postoperative nausea and vomiting)    Past Surgical History:  Procedure Laterality Date   ANTERIOR LAT LUMBAR FUSION Right 08/16/2019   Procedure: ANTERIOR LATERAL LUMBAR FUSION  RIGHT LUMBAR TWO- LUMBAR THREE WITH LATERAL PLATE;  Surgeon: Maeola Harman, MD;  Location: [facility] OR;  Service: Neurosurgery;  Laterality: Right;  ANTERIOR LATERAL LUMBAR FUSION  RIGHT LUMBAR 2- LUMBAR 3 WITH LATERAL PLATE   APPENDECTOMY     BACK SURGERY     Lumbar back surgery L3/4 ans L5/S1 rods and screws L3-S1 (Dr Channing Mutters) 02/29/2008   COLONOSCOPY     LAMINECTOMY      L4/5 1975,  L5/S1 1991   POLYPECTOMY     SHOULDER SURGERY Right    SPINAL CORD STIMULATOR IMPLANT     Family History  Problem Relation Age of Onset   Alzheimer's disease Mother    Dementia Mother    Heart disease Father    Hyperlipidemia Father    Dementia Father    Colon cancer Brother    Colon polyps Sister    Prostate cancer Neg Hx    Esophageal cancer Neg Hx    Rectal cancer Neg Hx    Stomach cancer Neg Hx    Social History   Socioeconomic History   Marital status: Married    Spouse name: Not on file   Number of children: 1   Years of education: Not on file   Highest education level: Not on file  Occupational History   Occupation: retired, because of back  Tobacco Use   Smoking status: Former   Smokeless tobacco: Former  Building services engineer status: Never Used  Substance and Sexual Activity   Alcohol use: Not Currently    Alcohol/week: 0.0 standard drinks of alcohol   Drug use: No   Sexual activity: Yes  Other Topics Concern   Not on file  Social History Narrative   Disabled from back pain   Prev EMS/ambulance driver   Divorced, remarried 2015   Social Determinants of Health   Financial Resource Strain: Low Risk  (05/18/2023)   Overall Financial Resource Strain (CARDIA)    Difficulty of Paying Living Expenses: Not hard at all  Food Insecurity: No Food Insecurity (05/18/2023)   Hunger Vital Sign     Worried About Running Out of Food in the Last Year: Never true    Ran Out of Food in the Last Year: Never true  Transportation Needs: No Transportation Needs (05/18/2023)   PRAPARE - Administrator, Civil Service (Medical): No    Lack of Transportation (Non-Medical): No  Physical Activity: Inactive (05/18/2023)   Exercise Vital Sign    Days of Exercise per Week: 0 days    Minutes of Exercise per Session: 0 min  Stress: No Stress Concern Present (05/18/2023)   [employer] of Occupational Health - Occupational  Stress Questionnaire    Feeling of Stress : Not at all  Social Connections: Socially Integrated (05/18/2023)   Social Connection and Isolation Panel [NHANES]    Frequency of Communication with Friends and Family: More than three times a week    Frequency of Social Gatherings with Friends and Family: More than three times a week    Attends Religious Services: More than 4 times per year    Active Member of [employer] or Organizations: Yes    Attends Engineer, structural: More than 4 times per year    Marital Status: Married    Tobacco Counseling Counseling given: Not Answered   Clinical Intake:  Pre-visit preparation completed: Yes  Pain : 0-10 Pain Score: 4  Pain Type: Chronic pain Pain Location: Generalized Pain Orientation: Lower Pain Descriptors / Indicators: Aching, Burning Pain Onset: Other (comment) (years)     BMI - recorded: 26.68 Nutritional Status: BMI 25 -29 Overweight Nutritional Risks: None Diabetes: No  How often do you need to have someone help you when you read instructions, pamphlets, or other written materials from your doctor or pharmacy?: 1 - Never  Interpreter Needed?: No  Information entered by :: C.Anntonette Madewell LPN   Activities of Daily Living    05/14/2023    8:39 AM  In your present state of health, do you have any difficulty performing the following activities:  Hearing? 1  Comment wears aids  Vision? 0  Difficulty  concentrating or making decisions? 0  Walking or climbing stairs? 0  Dressing or bathing? 0  Doing errands, shopping? 0  Preparing Food and eating ? N  Using the Toilet? N  In the past six months, have you accidently leaked urine? N  Do you have problems with loss of bowel control? N  Managing your Medications? N  Managing your Finances? N  Housekeeping or managing your Housekeeping? N    Patient Care Team: Joaquim Nam, MD as PCP - Oley Balm, MD as Consulting Physician (Orthopedic Surgery) Maeola Harman, MD as Consulting Physician (Neurosurgery) Kathyrn Sheriff, [facility] (Inactive) as Pharmacist (Pharmacist)  Indicate any recent Medical Services you may have received from other than [facility] providers in the past year (date may be approximate).     Assessment:   This is a routine wellness examination for Andrew [name].  Hearing/Vision screen Hearing Screening - Comments:: Hearing aid  Vision Screening - Comments:: Readers - [facility] - UTD on eye exams  Dietary issues and exercise activities discussed:     Goals Addressed             This Visit's Progress    Patient Stated       Stay active and have decent quality of life.       Depression Screen    05/18/2023    2:15 PM 05/14/2022    1:13 PM 05/10/2021   10:42 AM 05/09/2020    2:11 PM 04/13/2019   12:11 PM 04/07/2018    2:50 PM 06/03/2017    2:18 PM  PHQ 2/9 Scores  PHQ - 2 Score 0 0 0 0 0 0 0  PHQ- 9 Score  0 0 0 0 0   Exception Documentation     Medical reason      Fall Risk    05/14/2023    8:39 AM 05/14/2022    1:17 PM 05/10/2021   10:42 AM 05/09/2020    2:09 PM 04/13/2019   12:11 PM  Fall Risk   Falls  in the past year? 0 1 0 1 0  Comment    tripped on sidewalk   Number falls in past yr: 0 0 0 0   Injury with Fall? 1 0 0 1   Comment    broke wrist   Risk for fall due to : No Fall Risks History of fall(s) Medication side effect Medication side effect   Follow up Falls evaluation  completed;Falls prevention discussed Falls prevention discussed Falls evaluation completed;Falls prevention discussed Falls evaluation completed;Falls prevention discussed     MEDICARE RISK AT HOME:   TIMED UP AND GO:  Was the test performed?  No    Cognitive Function:    05/10/2021   10:44 AM 05/09/2020    2:14 PM 04/13/2019   12:08 PM 04/07/2018    2:54 PM 03/26/2016    1:32 PM  MMSE - Mini Mental State Exam  Orientation to time 5 5 5 5 5   Orientation to Place 5 5 5 5 5   Registration 3 3 3 3 3   Attention/ Calculation 5 5 0 0 0  Recall 3 3 3 3 3   Language- name 2 objects   0 0 0  Language- repeat 1 1 1 1 1   Language- follow 3 step command   0 3 3  Language- read & follow direction   0 0 0  Write a sentence   0 0 0  Copy design   0 0 0  Total score   17 20 20         05/18/2023    2:17 PM 05/14/2022    1:20 PM  6CIT Screen  What Year? 0 points 0 points  What month? 0 points 0 points  What time? 0 points 0 points  Count back from 20 0 points 0 points  Months in reverse 0 points 0 points  Repeat phrase 0 points 0 points  Total Score 0 points 0 points    Immunizations Immunization History  Administered Date(s) Administered   Influenza Whole 07/20/2013   Influenza, Seasonal, Injecte, Preservative Fre 08/02/2021   Influenza,inj,Quad PF,6+ Mos 07/13/2017, 07/26/2018, 07/21/2019, 07/24/2020   Influenza-Unspecified 07/13/2017, 04/25/2018, 07/26/2018, 07/21/2019, 07/16/2022   Moderna Sars-Covid-2 Vaccination 11/23/2019, 12/22/2019, 08/16/2020   PNEUMOCOCCAL CONJUGATE-20 07/28/2022   Respiratory Syncytial Virus Vaccine,Recomb Aduvanted(Arexvy) 08/13/2022   Td 01/02/2003   Tdap 05/27/2013   Unspecified SARS-COV-2 Vaccination 08/13/2022   Zoster Recombinant(Shingrix) 07/24/2020, 09/23/2020    TDAP status: Up to date  Flu Vaccine status: Up to date  Pneumococcal vaccine status: Up to date  Covid-19 vaccine status: Declined, Education has been provided regarding the  importance of this vaccine but patient still declined. Advised may receive this vaccine at local pharmacy or Health Dept.or vaccine clinic. Aware to provide a copy of the vaccination record if obtained from local pharmacy or Health Dept. Verbalized acceptance and understanding.  Qualifies for Shingles Vaccine? Yes   Zostavax completed No   Shingrix Completed?: Yes  Screening Tests Health Maintenance  Topic Date Due   COVID-19 Vaccine (5 - 2023-24 season) 05/18/2023 (Originally 10/08/2022)   INFLUENZA VACCINE  05/21/2023   DTaP/Tdap/Td (3 - Td or Tdap) 05/28/2023   Medicare Annual Wellness (AWV)  05/17/2024   Colonoscopy  01/23/2025   Pneumonia Vaccine 32+ Years old  Completed   Hepatitis C Screening  Completed   HIV Screening  Completed   Zoster Vaccines- Shingrix  Completed   HPV VACCINES  Aged Out    Health Maintenance  There are no preventive care reminders  to display for this patient.   Colorectal cancer screening: Type of screening: Colonoscopy. Completed 01/24/20. Repeat every 5 years  Lung Cancer Screening: (Low Dose CT Chest recommended if Age 68-80 years, 20 pack-year currently smoking OR have quit w/in 15years.) does not qualify.   Lung Cancer Screening Referral: n/a  Additional Screening:  Hepatitis C Screening: does qualify; Completed 10/02/15  Vision Screening: Recommended annual ophthalmology exams for early detection of glaucoma and other disorders of the eye. Is the patient up to date with their annual eye exam?  Yes  Who is the provider or what is the name of the office in which the patient attends annual eye exams? [facility] If pt is not established with a provider, would they like to be referred to a provider to establish care? No .   Dental Screening: Recommended annual dental exams for proper oral hygiene    Community Resource Referral / Chronic Care Management: CRR required this visit?  No   CCM required this visit?  No     Plan:     I  have personally reviewed and noted the following in the patient's chart:   Medical and social history Use of alcohol, tobacco or illicit drugs  Current medications and supplements including opioid prescriptions. Patient is currently taking opioid prescriptions. Information provided to patient regarding non-opioid alternatives. Patient advised to discuss non-opioid treatment plan with their provider. Functional ability and status Nutritional status Physical activity Advanced directives List of other physicians Hospitalizations, surgeries, and ER visits in previous 12 months Vitals Screenings to include cognitive, depression, and falls Referrals and appointments  In addition, I have reviewed and discussed with patient certain preventive protocols, quality metrics, and best practice recommendations. A written personalized care plan for preventive services as well as general preventive health recommendations were provided to patient.     Maryan Puls, LPN   2/44/0102   After Visit Summary: (MyChart) Due to this being a telephonic visit, the after visit summary with patients personalized plan was offered to patient via MyChart   Nurse Notes: none

## 2023-05-21 ENCOUNTER — Encounter: Payer: Medicare Other | Admitting: Family Medicine

## 2023-05-25 ENCOUNTER — Encounter: Payer: Self-pay | Admitting: Family Medicine

## 2023-05-25 ENCOUNTER — Ambulatory Visit (INDEPENDENT_AMBULATORY_CARE_PROVIDER_SITE_OTHER): Payer: Medicare Other | Admitting: Family Medicine

## 2023-05-25 VITALS — BP 138/72 | HR 83 | Temp 98.0°F | Ht 77.0 in | Wt 226.0 lb

## 2023-05-25 DIAGNOSIS — D696 Thrombocytopenia, unspecified: Secondary | ICD-10-CM | POA: Diagnosis not present

## 2023-05-25 DIAGNOSIS — Z0000 Encounter for general adult medical examination without abnormal findings: Secondary | ICD-10-CM

## 2023-05-25 DIAGNOSIS — Z7189 Other specified counseling: Secondary | ICD-10-CM

## 2023-05-25 DIAGNOSIS — R739 Hyperglycemia, unspecified: Secondary | ICD-10-CM | POA: Diagnosis not present

## 2023-05-25 DIAGNOSIS — G8929 Other chronic pain: Secondary | ICD-10-CM

## 2023-05-25 DIAGNOSIS — E7800 Pure hypercholesterolemia, unspecified: Secondary | ICD-10-CM

## 2023-05-25 DIAGNOSIS — M545 Low back pain, unspecified: Secondary | ICD-10-CM | POA: Diagnosis not present

## 2023-05-25 DIAGNOSIS — Z136 Encounter for screening for cardiovascular disorders: Secondary | ICD-10-CM

## 2023-05-25 MED ORDER — CELECOXIB 100 MG PO CAPS: 100.0000 mg | ORAL_CAPSULE | Freq: Two times a day (BID) | ORAL | 3 refills | Status: DC

## 2023-05-25 MED ORDER — METHOCARBAMOL 500 MG PO TABS: 500.0000 mg | ORAL_TABLET | Freq: Three times a day (TID) | ORAL | 3 refills | Status: DC | PRN

## 2023-05-25 MED ORDER — TRAMADOL HCL 50 MG PO TABS: 50.0000 mg | ORAL_TABLET | Freq: Three times a day (TID) | ORAL | 1 refills | Status: AC | PRN

## 2023-05-25 NOTE — Assessment & Plan Note (Signed)
Mildly low, not bleeding.  Not anemic and would not intervene. D/w pt.

## 2023-05-25 NOTE — Assessment & Plan Note (Addendum)
H/o fusion, stimulator placement, taking celebrex prn.    Lower back and leg pain.  D/w pt about using tramadol prn but hasn't needed it daily.  No ADE on med.  He is modifying his level activity based on his pain.  Robaxin helped.  No ADE on med.  Would continue as is.

## 2023-05-25 NOTE — Assessment & Plan Note (Signed)
Continue repatha.  Some fatigue, not clearly related to repatha- d/w pt.  Labs d/w pt.  I asked him to observe/update me as needed.

## 2023-05-25 NOTE — Patient Instructions (Addendum)
I would get a flu shot each fall.   Tdap when possible at the pharmacy.   Let me know if you don't get a call about the AAA screening.  Take care.  Glad to see you.

## 2023-05-25 NOTE — Assessment & Plan Note (Signed)
Tetanus 2014, d/w pt.  PNA up to date.   Shingrix done Flu shot done yearly.   covid 2021 RSV prev done.   PSA wnl 2024 Colonoscopy 2021 Living will d/w pt.  Wife Darel Hong designated if patient were incapacitated.   D/w pt about AAA screening.  Ordered 2024.

## 2023-05-25 NOTE — Assessment & Plan Note (Signed)
Living will d/w pt.  Wife Judy designated if patient were incapacitated.   

## 2023-05-25 NOTE — Progress Notes (Signed)
Back pain. H/o fusion, stimulator placement, taking celebrex prn.    Lower back and leg pain.  D/w pt about using tramadol prn but hasn't needed it daily.   No ADE on med.  He is modifying his level activity based on his pain.  Robaxin helped.  No ADE on med.     Elevated Cholesterol: Using medications without problems: yes Muscle aches: likely not from repatha.   Diet compliance:d/w pt.   Exercise: d/w pt.    Some fatigue, not clearly related to repatha- d/w pt.  Labs d/w pt.  I asked him to update me as needed.    Mild hyperglycemia, d/w pt.    PLT low but stable.  No bleeding.     Tetanus 2014, d/w pt.  PNA up to date.   Shingrix done Flu shot done yearly.   covid 2021 RSV prev done.   PSA wnl 2024 Colonoscopy 2021 Living will d/w pt.  Wife Darel Hong designated if patient were incapacitated.   D/w pt about AAA screening.  Ordered 2024.    He has eye and dental clinic f/u pending.   Meds, vitals, and allergies reviewed.   ROS: Per HPI unless specifically indicated in ROS section   GEN: nad, alert and oriented HEENT: ncat, TM wnl.  Has hearing aids.   NECK: supple w/o LA CV: rrr PULM: ctab, no inc wob ABD: soft, +bs EXT: no edema SKIN: no acute rash Back with lower spine surgical scar

## 2023-05-25 NOTE — Assessment & Plan Note (Signed)
Mild hyperglycemia, d/w pt about diet.  Exercise as back allows.

## 2023-05-27 ENCOUNTER — Encounter: Payer: Self-pay | Admitting: *Deleted

## 2023-06-10 ENCOUNTER — Ambulatory Visit
Admission: RE | Admit: 2023-06-10 | Discharge: 2023-06-10 | Disposition: A | Discharge status: Home / Self Care | Payer: Medicare Other | Source: Ambulatory Visit | Attending: Family Medicine | Admitting: Family Medicine

## 2023-06-10 DIAGNOSIS — Z136 Encounter for screening for cardiovascular disorders: Secondary | ICD-10-CM

## 2023-06-16 ENCOUNTER — Encounter: Payer: Self-pay | Admitting: Family Medicine

## 2023-06-25 ENCOUNTER — Other Ambulatory Visit: Payer: Self-pay | Admitting: Family Medicine

## 2023-06-25 DIAGNOSIS — E7800 Pure hypercholesterolemia, unspecified: Secondary | ICD-10-CM

## 2023-06-30 ENCOUNTER — Encounter: Payer: Self-pay | Admitting: Family Medicine

## 2023-06-30 DIAGNOSIS — M545 Low back pain, unspecified: Secondary | ICD-10-CM

## 2023-06-30 MED ORDER — PREDNISONE 20 MG PO TABS: ORAL_TABLET | ORAL | 0 refills | Status: DC

## 2023-07-01 ENCOUNTER — Encounter: Payer: Self-pay | Admitting: *Deleted

## 2023-07-01 NOTE — Telephone Encounter (Signed)
See patient message regarding CT

## 2023-07-09 ENCOUNTER — Ambulatory Visit (INDEPENDENT_AMBULATORY_CARE_PROVIDER_SITE_OTHER): Payer: Medicare Other | Admitting: Family Medicine

## 2023-07-09 ENCOUNTER — Encounter: Payer: Self-pay | Admitting: Family Medicine

## 2023-07-09 ENCOUNTER — Ambulatory Visit (INDEPENDENT_AMBULATORY_CARE_PROVIDER_SITE_OTHER)
Admission: RE | Admit: 2023-07-09 | Discharge: 2023-07-09 | Disposition: A | Discharge status: Home / Self Care | Payer: Medicare Other | Source: Ambulatory Visit | Attending: Family Medicine | Admitting: Family Medicine

## 2023-07-09 VITALS — BP 118/62 | HR 96 | Temp 97.6°F | Ht 77.0 in | Wt 223.0 lb

## 2023-07-09 DIAGNOSIS — M545 Low back pain, unspecified: Secondary | ICD-10-CM

## 2023-07-09 DIAGNOSIS — G8929 Other chronic pain: Secondary | ICD-10-CM | POA: Diagnosis not present

## 2023-07-09 DIAGNOSIS — M5430 Sciatica, unspecified side: Secondary | ICD-10-CM | POA: Diagnosis not present

## 2023-07-09 MED ORDER — PREDNISONE 20 MG PO TABS: ORAL_TABLET | ORAL | 0 refills | Status: DC

## 2023-07-09 NOTE — Progress Notes (Signed)
Back pain, d/w pt about neurosurgery f/u in [place].  Finishing prednisone.  Still in pain, with travel to clinic today.  He was able to get to [place] and prednisone helped prev.  Pain started when he was moving a sprinkler.  No trauma o/w.    Lower back pain, R leg pain, buttock and calf pain.  R ankle pain.  R groin pain.  Sx are clearly R>L leg with some L buttock pain.   He has L sided foot paresthesia at baseline but with additional L foot lateral paresthesia now.   He can't get MRI done given his previous procedures.  Prev CT myelogram with: 1. 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 This worsens with standing and extension, but improves with flexion. 2. New small left subarticular disc protrusion at L1-L2 with posterior displacement of the descending left L2 nerve root in the lateral recess. 3. Prior L3-S1 fusion without residual stenosis.  Myelogram cautions d/w pt.    He still has tramadol to use, cautions d/w pt.    Meds, vitals, and allergies reviewed.   ROS: Per HPI unless specifically indicated in ROS section   Nad Pain upon walking, uncomfortable sitting. Ncat Neck supple, no LA Rrr Ctab Abdomen soft.  Nontender. Right leg pain with right hip flexion. Left hip pain with left hip flexion. R lower back ttp.  Not tender to palpation on left lower back. L foot distally numb at baseline, now with altered L lateral food altered sensation.   No foot drop.

## 2023-07-09 NOTE — Patient Instructions (Signed)
Back films today, likely myelogram after that.  Continue prednisone- restart taper.  Tramadol if needed.  Take care.  Glad to see you.

## 2023-07-11 NOTE — Assessment & Plan Note (Signed)
Check plain films, then likely needs myelogram.  He is on follow-up with neurosurgery. Continue prednisone- restart taper.  Tramadol if needed.  Sedation caution. See notes on imaging.  Steroid cautions discussed with patient.  Okay for outpatient follow-up.

## 2023-07-14 ENCOUNTER — Telehealth: Payer: Self-pay

## 2023-07-14 DIAGNOSIS — M545 Low back pain, unspecified: Secondary | ICD-10-CM

## 2023-07-14 DIAGNOSIS — M5430 Sciatica, unspecified side: Secondary | ICD-10-CM

## 2023-07-14 NOTE — Telephone Encounter (Signed)
Routed to referral department for input.  Please let me know if I need to place another order.  Thanks.

## 2023-07-14 NOTE — Telephone Encounter (Signed)
CT.  Thanks.

## 2023-07-14 NOTE — Telephone Encounter (Signed)
[facility] called requesting a order CT myelogram be sent in? Call back # [phone]

## 2023-07-14 NOTE — Telephone Encounter (Signed)
Miranda from [facility] called and was wanting to know what kind of scan patient needed for the DG myelogram lumbar? Was it to be an xray, ct or MRI myelogram?

## 2023-07-14 NOTE — Telephone Encounter (Signed)
I put in the orders. Please let me know if those will not suffice.  Many thanks.

## 2023-07-14 NOTE — Addendum Note (Signed)
Addended by: Joaquim Nam on: 07/14/2023 04:51 PM   Modules accepted: Orders

## 2023-07-16 ENCOUNTER — Encounter: Payer: Self-pay | Admitting: *Deleted

## 2023-07-21 ENCOUNTER — Encounter: Payer: Self-pay | Admitting: Family Medicine

## 2023-07-22 NOTE — Telephone Encounter (Signed)
See result note.  

## 2024-05-18 ENCOUNTER — Ambulatory Visit (INDEPENDENT_AMBULATORY_CARE_PROVIDER_SITE_OTHER): Payer: Medicare Other

## 2024-05-18 VITALS — Ht 77.0 in | Wt 223.0 lb

## 2024-05-18 DIAGNOSIS — Z0000 Encounter for general adult medical examination without abnormal findings: Secondary | ICD-10-CM

## 2024-05-18 NOTE — Patient Instructions (Signed)
 Andrew [name] , Thank you for taking time out of your busy schedule to complete your Annual Wellness Visit with me. I enjoyed our conversation and look forward to speaking with you again next year. I, as well as your care team,  appreciate your ongoing commitment to your health goals. Please review the following plan we discussed and let me know if I can assist you in the future. Your Game plan/ To Do List     Follow up Visits: Next Medicare AWV with our clinical staff: 05/19/25 #  2:20pm televisit   Have you seen your provider in the last 6 months (3 months if uncontrolled diabetes)? No Next Office Visit with your provider: 06/07/24 physical  Clinician Recommendations:  Aim for 30 minutes of exercise or brisk walking, 6-8 glasses of water, and 5 servings of fruits and vegetables each day.       This is a list of the screening recommended for you and due dates:  Health Maintenance  Topic Date Due   Flu Shot  05/20/2024   Colon Cancer Screening  01/23/2025   Medicare Annual Wellness Visit  05/18/2025   DTaP/Tdap/Td vaccine (4 - Td or Tdap) 06/15/2033   Pneumococcal Vaccine for age over 8  Completed   Hepatitis C Screening  Completed   Zoster (Shingles) Vaccine  Completed   Hepatitis B Vaccine  Aged Out   HPV Vaccine  Aged Out   Meningitis B Vaccine  Aged Out   COVID-19 Vaccine  Discontinued    Advanced directives: (In Chart) A copy of your advanced directives are scanned into your chart should your provider ever need it. Advance Care Planning is important because it:  [x]  Makes sure you receive the medical care that is consistent with your values, goals, and preferences  [x]  It provides guidance to your family and loved ones and reduces their decisional burden about whether or not they are making the right decisions based on your wishes.  Follow the link provided in your after visit summary or read over the paperwork we have mailed to you to help you started getting your Advance  Directives in place. If you need assistance in completing these, please reach out to us  so that we can help you!

## 2024-05-18 NOTE — Progress Notes (Signed)
 Please attest and cosign this visit due to patients primary care provider not being in the office at the time the visit was completed.   Subjective:   Andrew [name] is a 68 y.o. who presents for a Medicare Wellness preventive visit.  As a reminder, Annual Wellness Visits don't include a physical exam, and some assessments may be limited, especially if this visit is performed virtually. We may recommend an in-person follow-up visit with your provider if needed.  Visit Complete: Virtual I connected with  Andrew [name] on 05/18/24 by a video and audio enabled telemedicine application and verified that I am speaking with the correct person using two identifiers.  Patient Location: Home  Provider Location: Office/Clinic  I discussed the limitations of evaluation and management by telemedicine. The patient expressed understanding and agreed to proceed.  Vital Signs: Because this visit was a virtual/telehealth visit, some criteria may be missing or patient reported. Any vitals not documented were not able to be obtained and vitals that have been documented are patient reported.  Persons Participating in Visit: Patient.  AWV Questionnaire: Yes: Patient Medicare AWV questionnaire was completed by the patient on 05/14/24; I have confirmed that all information answered by patient is correct and no changes since this date.  Cardiac Risk Factors include: advanced age (>53men, >71 women);dyslipidemia;male gender;sedentary lifestyle     Objective:    Today's Vitals   05/14/24 0852 05/18/24 1422  Weight:  223 lb (101.2 kg)  Height:  6' 5 (1.956 m)  PainSc: 5     Body mass index is 26.44 kg/m.     05/18/2024    2:33 PM 05/18/2023    2:16 PM 05/14/2022    1:15 PM 05/10/2021   10:41 AM 05/09/2020    2:08 PM 08/12/2019   10:20 AM 04/13/2019   12:12 PM  Advanced Directives  Does Patient Have a Medical Advance Directive? Yes Yes Yes Yes Yes Yes Yes  Type of Estate agent of  [place];Living will Healthcare Power of [place];Living will Healthcare Power of [place];Living will Healthcare Power of [place];Living will Healthcare Power of [place];Living will Healthcare Power of [place];Living will Healthcare Power of [place];Living will  Does patient want to make changes to medical advance directive?  No - Patient declined Yes (Inpatient - patient defers changing a medical advance directive and declines information at this time)      Copy of Healthcare Power of Attorney in Chart? Yes - validated most recent copy scanned in chart (See row information) Yes - validated most recent copy scanned in chart (See row information) Yes - validated most recent copy scanned in chart (See row information) Yes - validated most recent copy scanned in chart (See row information) No - copy requested  No - copy requested      Data saved with a previous flowsheet row definition    Current Medications (verified) Outpatient Encounter Medications as of 05/18/2024  Medication Sig   acetaminophen  (TYLENOL ) 650 MG CR tablet Take 1,300 mg by mouth every 8 (eight) hours as needed for pain.   celecoxib  (CELEBREX ) 100 MG capsule Take 1 capsule (100 mg total) by mouth 2 (two) times daily.   docusate sodium  (COLACE) 50 MG capsule Take 50 mg by mouth 2 (two) times daily as needed for mild constipation.   methocarbamol  (ROBAXIN ) 500 MG tablet Take 1 tablet (500 mg total) by mouth every 8 (eight) hours as needed for muscle spasms.   Omega-3 Fatty Acids (FISH OIL) 1000 MG  CAPS Take 1,000 mg by mouth daily.   omeprazole (PRILOSEC) 20 MG capsule Take 20 mg by mouth as needed.   REPATHA  SURECLICK 140 MG/ML SOAJ INJECT 140MG  SUBCUTANEOUSLY  EVERY 2 WEEKS   traMADol  (ULTRAM ) 50 MG tablet Take 1 tablet (50 mg total) by mouth every 8 (eight) hours as needed.   predniSONE  (DELTASONE ) 20 MG tablet Take 2 a day for 5 days, then 1 a day for 5 days, with food. Don't take with aleve/ibuprofen/celebrex    No  facility-administered encounter medications on file as of 05/18/2024.    Allergies (verified) Lyrica [pregabalin], Neurontin [gabapentin], Atorvastatin  calcium , Escitalopram oxalate, Pravastatin  sodium, and Sertraline hcl   History: Past Medical History:  Diagnosis Date   Anxiety    many years ago but situational    Arthritis    Back pain    With spinal cord stimulator.  S/p lumbar fusion, diskectomy.  Prev seen by Dr. Unice    Complication of anesthesia    Constipation    uses Colace- he goes every 4-5 days- some hard stools    Diverticulitis    Diverticulosis    Erectile dysfunction    GERD (gastroesophageal reflux disease)    OCC    Hyperlipidemia    Leg paresthesia    L foot numb- since 1999 surgery   Neuromuscular disorder (HCC)    siatica    PONV (postoperative nausea and vomiting)    Past Surgical History:  Procedure Laterality Date   ANTERIOR LAT LUMBAR FUSION Right 08/16/2019   Procedure: ANTERIOR LATERAL LUMBAR FUSION  RIGHT LUMBAR TWO- LUMBAR THREE WITH LATERAL PLATE;  Surgeon: Unice Pac, MD;  Location: [facility] OR;  Service: Neurosurgery;  Laterality: Right;  ANTERIOR LATERAL LUMBAR FUSION  RIGHT LUMBAR 2- LUMBAR 3 WITH LATERAL PLATE   APPENDECTOMY     BACK SURGERY     Lumbar back surgery L3/4 ans L5/S1 rods and screws L3-S1 (Dr Gaither) 02/29/2008   COLONOSCOPY     LAMINECTOMY      L4/5 1975,  L5/S1 1991   POLYPECTOMY     SHOULDER SURGERY Right    SPINAL CORD STIMULATOR IMPLANT     SPINE SURGERY  [id number], 2010,2020   Multiple disc surgery including spinal fusions   Family History  Problem Relation Age of Onset   Alzheimer's disease Mother    Dementia Mother    Alcohol abuse Mother    Heart disease Father    Hyperlipidemia Father    Dementia Father    Alcohol abuse Father    Colon cancer Brother    Colon polyps Sister    Prostate cancer Neg Hx    Esophageal cancer Neg Hx    Rectal cancer Neg Hx    Stomach cancer Neg Hx    Social History    Socioeconomic History   Marital status: Married    Spouse name: Not on file   Number of children: 1   Years of education: Not on file   Highest education level: Bachelor's degree (e.g., BA, AB, BS)  Occupational History   Occupation: retired, because of back  Tobacco Use   Smoking status: Former    Current packs/day: 0.00    Types: Cigarettes    Quit date: 02/20/1993    Years since quitting: 31.2   Smokeless tobacco: Former  Building services engineer status: Never Used  Substance and Sexual Activity   Alcohol use: Not Currently   Drug use: No   Sexual activity: Not Currently  Other Topics  Concern   Not on file  Social History Narrative   Disabled from back pain   Prev EMS/ambulance driver   Divorced, remarried 2015   Social Drivers of Health   Financial Resource Strain: Low Risk  (05/14/2024)   Overall Financial Resource Strain (CARDIA)    Difficulty of Paying Living Expenses: Not hard at all  Food Insecurity: No Food Insecurity (05/14/2024)   Hunger Vital Sign    Worried About Running Out of Food in the Last Year: Never true    Ran Out of Food in the Last Year: Never true  Transportation Needs: No Transportation Needs (05/14/2024)   PRAPARE - Administrator, Civil Service (Medical): No    Lack of Transportation (Non-Medical): No  Physical Activity: Insufficiently Active (05/14/2024)   Exercise Vital Sign    Days of Exercise per Week: 1 day    Minutes of Exercise per Session: 10 min  Stress: No Stress Concern Present (05/14/2024)   [employer] of Occupational Health - Occupational Stress Questionnaire    Feeling of Stress: Only a little  Social Connections: Socially Integrated (05/14/2024)   Social Connection and Isolation Panel    Frequency of Communication with Friends and Family: More than three times a week    Frequency of Social Gatherings with Friends and Family: Once a week    Attends Religious Services: More than 4 times per year    Active Member of  [employer] or Organizations: Yes    Attends Engineer, structural: More than 4 times per year    Marital Status: Married    Tobacco Counseling Counseling given: Not Answered    Clinical Intake:  Pre-visit preparation completed: Yes  Pain : 0-10 Pain Score: 5  Pain Type: Chronic pain Pain Location: Back Pain Orientation: Lower Pain Descriptors / Indicators: Aching Pain Onset: More than a month ago Pain Frequency: Constant Pain Relieving Factors: medications  Pain Relieving Factors: medications  BMI - recorded: 26.44 Nutritional Status: BMI 25 -29 Overweight Nutritional Risks: None Diabetes: No  Lab Results  Component Value Date   HGBA1C 6.4 05/06/2023   HGBA1C 5.9 05/07/2022   HGBA1C 5.7 05/08/2021     How often do you need to have someone help you when you read instructions, pamphlets, or other written materials from your doctor or pharmacy?: 1 - Never  Interpreter Needed?: No  Comments: lives with wife Information entered by :: B.Gloriana Piltz,LPN   Activities of Daily Living     05/14/2024    8:52 AM  In your present state of health, do you have any difficulty performing the following activities:  Hearing? 1  Vision? 0  Difficulty concentrating or making decisions? 0  Walking or climbing stairs? 0  Dressing or bathing? 0  Doing errands, shopping? 0  Preparing Food and eating ? N  Using the Toilet? N  In the past six months, have you accidently leaked urine? N  Do you have problems with loss of bowel control? N  Managing your Medications? N  Managing your Finances? N  Housekeeping or managing your Housekeeping? N    Patient Care Team: Cleatus Arlyss RAMAN, MD as PCP - Diedre Melita Drivers, MD as Consulting Physician (Orthopedic Surgery) Unice Pac, MD as Consulting Physician (Neurosurgery) Fate Morna SAILOR, [facility] (Inactive) as Pharmacist (Pharmacist) [facility]  I have updated your Care Teams any recent Medical  Services you may have received from other providers in the past year.     Assessment:  This is a routine wellness examination for [place].  Hearing/Vision screen Hearing Screening - Comments:: Pt says hears well w/hearing aids Vision Screening - Comments:: Pt says his vision is good w/[facility] [place]   Goals Addressed             This Visit's Progress    DIET - EAT MORE FRUITS AND VEGETABLES   On track    05/18/24     Patient Stated   On track    05/18/24-Stay active and have decent quality of life.       Depression Screen     05/18/2024    2:31 PM 05/25/2023    8:58 AM 05/18/2023    2:15 PM 05/14/2022    1:13 PM 05/10/2021   10:42 AM 05/09/2020    2:11 PM 04/13/2019   12:11 PM  PHQ 2/9 Scores  PHQ - 2 Score 0 0 0 0 0 0 0  PHQ- 9 Score  1  0 0 0 0  Exception Documentation       Medical reason    Fall Risk     05/14/2024    8:52 AM 05/25/2023    8:57 AM 05/14/2023    8:39 AM 05/14/2022    1:17 PM 05/10/2021   10:42 AM  Fall Risk   Falls in the past year? 0 0 0 1 0  Number falls in past yr: 0 0 0 0 0  Injury with Fall? 0 0 1 0 0  Risk for fall due to : No Fall Risks No Fall Risks No Fall Risks History of fall(s) Medication side effect  Follow up Falls prevention discussed;Education provided Falls evaluation completed Falls evaluation completed;Falls prevention discussed Falls prevention discussed  Falls evaluation completed;Falls prevention discussed      Data saved with a previous flowsheet row definition    MEDICARE RISK AT HOME:  Medicare Risk at Home Any stairs in or around the home?: (Patient-Rptd) Yes If so, are there any without handrails?: (Patient-Rptd) Yes Home free of loose throw rugs in walkways, pet beds, electrical cords, etc?: (Patient-Rptd) Yes Adequate lighting in your home to reduce risk of falls?: (Patient-Rptd) Yes Life alert?: (Patient-Rptd) No Use of a cane, walker or w/c?: (Patient-Rptd) No Grab bars in the bathroom?: (Patient-Rptd)  Yes Shower chair or bench in shower?: (Patient-Rptd) No Elevated toilet seat or a handicapped toilet?: (Patient-Rptd) Yes  TIMED UP AND GO:  Was the test performed?  No  Cognitive Function: 6CIT completed    05/10/2021   10:44 AM 05/09/2020    2:14 PM 04/13/2019   12:08 PM 04/07/2018    2:54 PM 03/26/2016    1:32 PM  MMSE - Mini Mental State Exam  Orientation to time 5 5 5 5 5    Orientation to Place 5 5 5 5 5    Registration 3 3 3 3 3    Attention/ Calculation 5 5 0 0 0   Recall 3 3 3 3 3    Language- name 2 objects   0 0 0   Language- repeat 1 1 1 1 1   Language- follow 3 step command   0 3 3   Language- read & follow direction   0 0 0   Write a sentence   0 0 0   Copy design   0 0 0   Total score   17 20 20       Data saved with a previous flowsheet row definition  05/18/2024    2:40 PM 05/18/2023    2:17 PM 05/14/2022    1:20 PM  6CIT Screen  What Year? 0 points 0 points 0 points  What month? 0 points 0 points 0 points  What time? 0 points 0 points 0 points  Count back from 20 0 points 0 points 0 points  Months in reverse 0 points 0 points 0 points  Repeat phrase 0 points 0 points 0 points  Total Score 0 points 0 points 0 points    Immunizations Immunization History  Administered Date(s) Administered   Influenza Whole 07/20/2013   Influenza, Seasonal, Injecte, Preservative Fre 08/02/2021   Influenza,inj,Quad PF,6+ Mos 07/13/2017, 07/26/2018, 07/21/2019, 07/24/2020, 06/16/2023   Influenza-Unspecified 07/13/2017, 04/25/2018, 07/26/2018, 07/21/2019, 07/16/2022   Moderna Sars-Covid-2 Vaccination 11/23/2019, 12/22/2019, 08/16/2020   PNEUMOCOCCAL CONJUGATE-20 07/28/2022   Respiratory Syncytial Virus Vaccine,Recomb Aduvanted(Arexvy) 08/13/2022   Td 01/02/2003   Tdap 05/27/2013, 06/16/2023   Unspecified SARS-COV-2 Vaccination 08/13/2022   Zoster Recombinant(Shingrix) 07/24/2020, 09/23/2020    Screening Tests Health Maintenance  Topic Date Due   INFLUENZA VACCINE   05/20/2024   Colonoscopy  01/23/2025   Medicare Annual Wellness (AWV)  05/18/2025   DTaP/Tdap/Td (4 - Td or Tdap) 06/15/2033   Pneumococcal Vaccine: 50+ Years  Completed   Hepatitis C Screening  Completed   Zoster Vaccines- Shingrix  Completed   Hepatitis B Vaccines  Aged Out   HPV VACCINES  Aged Out   Meningococcal B Vaccine  Aged Out   COVID-19 Vaccine  Discontinued    Health Maintenance  There are no preventive care reminders to display for this patient.  Health Maintenance Items Addressed: None needed at this time  Additional Screening:  Vision Screening: Recommended annual ophthalmology exams for early detection of glaucoma and other disorders of the eye. Would you like a referral to an eye doctor? No    Dental Screening: Recommended annual dental exams for proper oral hygiene  Community Resource Referral / Chronic Care Management: CRR required this visit?  No   CCM required this visit?  No   Plan:    I have personally reviewed and noted the following in the patient's chart:   Medical and social history Use of alcohol, tobacco or illicit drugs  Current medications and supplements including opioid prescriptions. Patient is currently taking opioid prescriptions. Information provided to patient regarding non-opioid alternatives. Patient advised to discuss non-opioid treatment plan with their provider. Functional ability and status Nutritional status Physical activity Advanced directives List of other physicians Hospitalizations, surgeries, and ER visits in previous 12 months Vitals Screenings to include cognitive, depression, and falls Referrals and appointments  In addition, I have reviewed and discussed with patient certain preventive protocols, quality metrics, and best practice recommendations. A written personalized care plan for preventive services as well as general preventive health recommendations were provided to patient.   Erminio LITTIE Saris,  LPN   [id number]   After Visit Summary: (MyChart) Due to this being a telephonic visit, the after visit summary with patients personalized plan was offered to patient via MyChart   Notes: Please refer to Routing Comments.    COMMENTS to PCP: Hi! AWV done! Pt relays he has been taking Tylenol -PM every night for sleep and pain for about 2 months. He wants to know is safe/recommended. Pt has an appt with you 06/07/24. Thank you!Erminio

## 2024-05-21 ENCOUNTER — Other Ambulatory Visit: Payer: Self-pay | Admitting: Family Medicine

## 2024-05-21 DIAGNOSIS — E7800 Pure hypercholesterolemia, unspecified: Secondary | ICD-10-CM

## 2024-05-30 ENCOUNTER — Other Ambulatory Visit: Payer: Self-pay | Admitting: Family Medicine

## 2024-05-30 DIAGNOSIS — R739 Hyperglycemia, unspecified: Secondary | ICD-10-CM

## 2024-05-30 DIAGNOSIS — E7800 Pure hypercholesterolemia, unspecified: Secondary | ICD-10-CM

## 2024-05-30 DIAGNOSIS — Z125 Encounter for screening for malignant neoplasm of prostate: Secondary | ICD-10-CM

## 2024-06-01 ENCOUNTER — Other Ambulatory Visit

## 2024-06-01 ENCOUNTER — Ambulatory Visit: Payer: Self-pay | Admitting: Family Medicine

## 2024-06-01 DIAGNOSIS — R739 Hyperglycemia, unspecified: Secondary | ICD-10-CM

## 2024-06-01 DIAGNOSIS — Z125 Encounter for screening for malignant neoplasm of prostate: Secondary | ICD-10-CM | POA: Diagnosis not present

## 2024-06-01 DIAGNOSIS — E7800 Pure hypercholesterolemia, unspecified: Secondary | ICD-10-CM

## 2024-06-01 LAB — COMPREHENSIVE METABOLIC PANEL WITH GFR
ALT: 33 U/L (ref 0–53)
AST: 21 U/L (ref 0–37)
Albumin: 4.5 g/dL (ref 3.5–5.2)
Alkaline Phosphatase: 48 U/L (ref 39–117)
BUN: 16 mg/dL (ref 6–23)
CO2: 28 meq/L (ref 19–32)
Calcium: 9.3 mg/dL (ref 8.4–10.5)
Chloride: 106 meq/L (ref 96–112)
Creatinine, Ser: 0.95 mg/dL (ref 0.40–1.50)
GFR: 83.15 mL/min (ref >60.00)
Glucose, Bld: 107 mg/dL — ABNORMAL HIGH (ref 70–99)
Potassium: 4.8 meq/L (ref 3.5–5.1)
Sodium: 140 meq/L (ref 135–145)
Total Bilirubin: 0.5 mg/dL (ref 0.2–1.2)
Total Protein: 6.9 g/dL (ref 6.0–8.3)

## 2024-06-01 LAB — CBC WITH DIFFERENTIAL/PLATELET
Basophils Absolute: 0 K/uL (ref 0.0–0.1)
Basophils Relative: 0.7 % (ref 0.0–3.0)
Eosinophils Absolute: 0.2 K/uL (ref 0.0–0.7)
Eosinophils Relative: 3.3 % (ref 0.0–5.0)
HCT: 40.5 % (ref 39.0–52.0)
Hemoglobin: 13.3 g/dL (ref 13.0–17.0)
Lymphocytes Relative: 33.7 % (ref 12.0–46.0)
Lymphs Abs: 1.8 K/uL (ref 0.7–4.0)
MCHC: 33 g/dL (ref 30.0–36.0)
MCV: 83.3 fl (ref 78.0–100.0)
Monocytes Absolute: 0.5 K/uL (ref 0.1–1.0)
Monocytes Relative: 9.4 % (ref 3.0–12.0)
Neutro Abs: 2.9 K/uL (ref 1.4–7.7)
Neutrophils Relative %: 52.9 % (ref 43.0–77.0)
Platelets: 164 K/uL (ref 150.0–400.0)
RBC: 4.86 Mil/uL (ref 4.22–5.81)
RDW: 14 % (ref 11.5–15.5)
WBC: 5.5 K/uL (ref 4.0–10.5)

## 2024-06-01 LAB — LIPID PANEL
Cholesterol: 133 mg/dL (ref 0–200)
HDL: 42.3 mg/dL (ref >39.00)
LDL Cholesterol: 71 mg/dL (ref 0–99)
NonHDL: 91.04
Total CHOL/HDL Ratio: 3
Triglycerides: 102 mg/dL (ref 0.0–149.0)
VLDL: 20.4 mg/dL (ref 0.0–40.0)

## 2024-06-01 LAB — TSH: TSH: 1.71 u[IU]/mL (ref 0.35–5.50)

## 2024-06-01 LAB — HEMOGLOBIN A1C: Hgb A1c MFr Bld: 6.5 % (ref 4.6–6.5)

## 2024-06-01 LAB — PSA, MEDICARE: PSA: 0.29 ng/mL (ref 0.10–4.00)

## 2024-06-07 ENCOUNTER — Ambulatory Visit (INDEPENDENT_AMBULATORY_CARE_PROVIDER_SITE_OTHER): Admitting: Family Medicine

## 2024-06-07 ENCOUNTER — Encounter: Payer: Self-pay | Admitting: Family Medicine

## 2024-06-07 VITALS — BP 122/70 | HR 90 | Temp 98.3°F | Ht 77.0 in | Wt 226.4 lb

## 2024-06-07 DIAGNOSIS — M545 Low back pain, unspecified: Secondary | ICD-10-CM

## 2024-06-07 DIAGNOSIS — G8929 Other chronic pain: Secondary | ICD-10-CM | POA: Diagnosis not present

## 2024-06-07 DIAGNOSIS — Z0000 Encounter for general adult medical examination without abnormal findings: Secondary | ICD-10-CM

## 2024-06-07 DIAGNOSIS — E7800 Pure hypercholesterolemia, unspecified: Secondary | ICD-10-CM | POA: Diagnosis not present

## 2024-06-07 DIAGNOSIS — E119 Type 2 diabetes mellitus without complications: Secondary | ICD-10-CM | POA: Diagnosis not present

## 2024-06-07 DIAGNOSIS — Z7189 Other specified counseling: Secondary | ICD-10-CM

## 2024-06-07 MED ORDER — CELECOXIB 100 MG PO CAPS: 100.0000 mg | ORAL_CAPSULE | Freq: Two times a day (BID) | ORAL | 3 refills | Status: AC

## 2024-06-07 MED ORDER — METHOCARBAMOL 500 MG PO TABS: 500.0000 mg | ORAL_TABLET | Freq: Three times a day (TID) | ORAL | 1 refills | Status: AC | PRN

## 2024-06-07 NOTE — Patient Instructions (Signed)
 Use the eat right diet and recheck A1c in about 4 months [facility].  I would get a flu shot each fall.   Take care.  Glad to see you.

## 2024-06-07 NOTE — Progress Notes (Unsigned)
 DM2 based on A1c.  D/w pt about A1c and diet options.  Discussed deferring diabetic health maintenance until he can have a chance to work on diet and recheck his A1c in a few months.  Low sugar diet handout given to patient and discussed.  He agrees with plan.  Elevated Cholesterol: Using medications without problems: yes Muscle aches: not likely from repatha .  Diet compliance: d/w pt.  Exercise: d/w pt.   Labs d/w pt.    Neurosurgery evaluation d/w pt.  He saw clinic in [place].  Still on celebrex .  No planned surgery.  He is dealing with pain at baseline.  Injections didn't help but for a few days.  Not taking tramadol .  Still taking tylenol  and prn robaxin .    Tetanus 2024 PNA up to date.   Shingrix done Flu shot done yearly.   covid 2021 RSV prev done.   PSA wnl 2025 Colonoscopy 2021 Living will d/w pt.  Wife Dagoberto designated if patient were incapacitated.   AAA screening neg 2024.    PMH and SH reviewed  Meds, vitals, and allergies reviewed.   ROS: Per HPI unless specifically indicated in ROS section   GEN: nad, alert and oriented HEENT: mucous membranes moist NECK: supple w/o LA CV: rrr. PULM: ctab, no inc wob ABD: soft, +bs EXT: no edema SKIN: Well-perfused.

## 2024-06-08 ENCOUNTER — Encounter: Payer: Self-pay | Admitting: Family Medicine

## 2024-06-08 DIAGNOSIS — E119 Type 2 diabetes mellitus without complications: Secondary | ICD-10-CM | POA: Insufficient documentation

## 2024-06-08 NOTE — Assessment & Plan Note (Signed)
 Would continue with Repatha .  Labs discussed with patient.  Diet discussed.  Exercise is limited by back pain.

## 2024-06-08 NOTE — Assessment & Plan Note (Signed)
Living will d/w pt.  Wife Judy designated if patient were incapacitated.   

## 2024-06-08 NOTE — Assessment & Plan Note (Signed)
 He saw clinic in [name].  Still on celebrex .  No planned surgery.  He is dealing with pain at baseline.  Injections didn't help but for a few days.  Not taking tramadol .  Still taking tylenol  and prn robaxin .   He can update me if he is having worsening pain and if he needs to see neurosurgery in [place] again.

## 2024-06-08 NOTE — Assessment & Plan Note (Signed)
 DM2 based on A1c.  D/w pt about A1c and diet options.  Discussed deferring diabetic health maintenance until he can have a chance to work on diet and recheck his A1c in a few months.  Low sugar diet handout given to patient and discussed.  He agrees with plan.

## 2024-06-08 NOTE — Assessment & Plan Note (Signed)
  Tetanus 2024 PNA up to date.   Shingrix done Flu shot done yearly.   covid 2021 RSV prev done.   PSA wnl 2025 Colonoscopy 2021 Living will d/w pt.  Wife Dagoberto designated if patient were incapacitated.   AAA screening neg 2024.

## 2024-07-13 ENCOUNTER — Encounter: Payer: Self-pay | Admitting: Family Medicine

## 2024-07-13 DIAGNOSIS — R29898 Other symptoms and signs involving the musculoskeletal system: Secondary | ICD-10-CM | POA: Insufficient documentation

## 2024-08-08 ENCOUNTER — Encounter: Payer: Self-pay | Admitting: Family Medicine

## 2024-09-19 ENCOUNTER — Encounter: Payer: Self-pay | Admitting: Family Medicine

## 2024-09-19 NOTE — Telephone Encounter (Signed)
 Spoke with pt scheduling 4 mo lab visit for A1c recheck on 10/04/24 at 9:30 (see 06/07/24 OV notes). Pls place order.

## 2024-09-21 ENCOUNTER — Other Ambulatory Visit: Payer: Self-pay | Admitting: Family Medicine

## 2024-09-21 DIAGNOSIS — E119 Type 2 diabetes mellitus without complications: Secondary | ICD-10-CM

## 2024-10-04 ENCOUNTER — Other Ambulatory Visit

## 2024-10-04 DIAGNOSIS — E119 Type 2 diabetes mellitus without complications: Secondary | ICD-10-CM

## 2024-10-04 LAB — HEMOGLOBIN A1C: Hgb A1c MFr Bld: 6 % (ref 4.6–6.5)

## 2024-10-11 ENCOUNTER — Ambulatory Visit: Payer: Self-pay | Admitting: Family Medicine

## 2025-05-19 ENCOUNTER — Ambulatory Visit
